# Patient Record
Sex: Female | Born: 1980 | Race: Asian | Hispanic: No | Marital: Married | Smoking: Never smoker
Health system: Southern US, Community
[De-identification: ages and names within clinical notes are randomized; demographics above are authoritative.]

## PROBLEM LIST (undated history)

## (undated) DIAGNOSIS — K759 Inflammatory liver disease, unspecified: Secondary | ICD-10-CM

## (undated) HISTORY — DX: Inflammatory liver disease, unspecified: K75.9

## (undated) HISTORY — PX: NO PAST SURGERIES: SHX2092

---

## 2014-01-12 NOTE — L&D Delivery Note (Signed)
Patient is 34 y.o. G1P0000 [redacted]w[redacted]d admitted for IOL 2/2 gHTN, hx of chronic hepatitis B,  GBS+ with adequate ppx  Operative Delivery Note At 9:44 PM a viable female was delivered via Vaginal, Spontaneous Delivery.  Presentation: vertex; Position: Anterior; Station: +3.  Verbal consent: obtained from patient.  Risks and benefits discussed in detail.  Risks include, but are not limited to the risks of anesthesia, bleeding, infection, damage to maternal tissues, fetal cephalhematoma.  There is also the risk of inability to effect vaginal delivery of the head, or shoulder dystocia that cannot be resolved by established maneuvers, leading to the need for emergency cesarean section.  ~52min shoulder dystocia Delivery of the head:   ,   1st maneuver: McRoberts 2nd Maneuver: attempted delivery of posterior shoulder 3rd Maneuver: successful woods corkscrew 4th Maneuver: Suprapubic pressure 5th Maneuver: delivery of posterior shoulder  APGAR: 9, 9; weight 7 lb 15.3 oz (3610 g).   Placenta status: Intact, Spontaneous.   Cord: 3 vessels with the following complications: None.  Cord pH: 7.  Anesthesia: Epidural  Instruments: Kiwi Vacuum Episiotomy: None Lacerations: 3rd degree  Extensive 3rd degree with extensions to bilateral labia.  Left labia extending to large deficit that was bleeding heavily.  Repair hemostatic. Suture Repair: 3.0 vicryl Est. Blood Loss (mL):  Mom to postpartum.  Baby to Couplet care / Skin to Skin.  Postpartum hemorrhage secondary to vaginal bleeding => CBC ordered.  Patient febrile at time of delivery with leukocytosis of 17,000.  No maternal or fetal tachycardia, no foul smelling odor, no signs of intra-mniotic infection.  Christy Huff 07/03/2014, 11:00 PM

## 2014-02-01 ENCOUNTER — Other Ambulatory Visit (HOSPITAL_COMMUNITY): Payer: Self-pay | Admitting: Nurse Practitioner

## 2014-02-01 DIAGNOSIS — Z3689 Encounter for other specified antenatal screening: Secondary | ICD-10-CM

## 2014-02-08 ENCOUNTER — Ambulatory Visit (HOSPITAL_COMMUNITY)
Admission: RE | Admit: 2014-02-08 | Discharge: 2014-02-08 | Disposition: A | Discharge status: Home / Self Care | Payer: Self-pay | Source: Ambulatory Visit | Attending: Nurse Practitioner | Admitting: Nurse Practitioner

## 2014-02-08 DIAGNOSIS — Z3689 Encounter for other specified antenatal screening: Secondary | ICD-10-CM

## 2014-02-08 DIAGNOSIS — Z36 Encounter for antenatal screening of mother: Secondary | ICD-10-CM | POA: Insufficient documentation

## 2014-07-02 ENCOUNTER — Inpatient Hospital Stay (HOSPITAL_COMMUNITY)
Admission: AD | Admit: 2014-07-02 | Discharge: 2014-07-05 | DRG: 988 | Disposition: A | Discharge status: Home / Self Care | Payer: Medicaid Other | Source: Ambulatory Visit | Attending: Family Medicine | Admitting: Family Medicine

## 2014-07-02 ENCOUNTER — Encounter (HOSPITAL_COMMUNITY): Payer: Self-pay | Admitting: *Deleted

## 2014-07-02 DIAGNOSIS — Z3A39 39 weeks gestation of pregnancy: Secondary | ICD-10-CM | POA: Diagnosis present

## 2014-07-02 DIAGNOSIS — O99824 Streptococcus B carrier state complicating childbirth: Secondary | ICD-10-CM | POA: Diagnosis present

## 2014-07-02 DIAGNOSIS — O133 Gestational [pregnancy-induced] hypertension without significant proteinuria, third trimester: Secondary | ICD-10-CM | POA: Diagnosis present

## 2014-07-02 DIAGNOSIS — O139 Gestational [pregnancy-induced] hypertension without significant proteinuria, unspecified trimester: Secondary | ICD-10-CM | POA: Diagnosis present

## 2014-07-02 LAB — CREATININE CLEARANCE, URINE, 24 HOUR
Collection Interval-CRCL: 24 hours
Creatinine Clearance: 149 mL/min — ABNORMAL HIGH (ref 75–115)
Creatinine, 24H Ur: 1267 mg/d (ref 600–1800)
Creatinine, Urine: 84.48 mg/dL
Urine Total Volume-CRCL: 1500 mL

## 2014-07-02 LAB — CBC
HEMATOCRIT: 39.1 % (ref 36.0–46.0)
Hemoglobin: 13.3 g/dL (ref 12.0–15.0)
MCH: 27.7 pg (ref 26.0–34.0)
MCHC: 34 g/dL (ref 30.0–36.0)
MCV: 81.3 fL (ref 78.0–100.0)
Platelets: 198 10*3/uL (ref 150–400)
RBC: 4.81 MIL/uL (ref 3.87–5.11)
RDW: 14.9 % (ref 11.5–15.5)
WBC: 7.3 10*3/uL (ref 4.0–10.5)

## 2014-07-02 LAB — TYPE AND SCREEN
ABO/RH(D): A POS
Antibody Screen: NEGATIVE

## 2014-07-02 LAB — COMPREHENSIVE METABOLIC PANEL
ALT: 14 U/L (ref 14–54)
AST: 28 U/L (ref 15–41)
Albumin: 3 g/dL — ABNORMAL LOW (ref 3.5–5.0)
Alkaline Phosphatase: 328 U/L — ABNORMAL HIGH (ref 38–126)
Anion gap: 7 (ref 5–15)
BUN: 8 mg/dL (ref 6–20)
CALCIUM: 8.8 mg/dL — AB (ref 8.9–10.3)
CO2: 20 mmol/L — ABNORMAL LOW (ref 22–32)
Chloride: 108 mmol/L (ref 101–111)
Creatinine, Ser: 0.59 mg/dL (ref 0.44–1.00)
GFR calc non Af Amer: >60 mL/min (ref >60)
GLUCOSE: 84 mg/dL (ref 65–99)
Potassium: 4 mmol/L (ref 3.5–5.1)
SODIUM: 135 mmol/L (ref 135–145)
TOTAL PROTEIN: 6.5 g/dL (ref 6.5–8.1)
Total Bilirubin: 0.5 mg/dL (ref 0.3–1.2)

## 2014-07-02 LAB — OB RESULTS CONSOLE RPR: RPR: NONREACTIVE

## 2014-07-02 LAB — PROTEIN, URINE, 24 HOUR
COLLECTION INTERVAL-UPROT: 24 h
Protein, 24H Urine: 150 mg/d — ABNORMAL HIGH (ref 50–100)
Protein, Urine: 10 mg/dL
Urine Total Volume-UPROT: 1500 mL

## 2014-07-02 LAB — OB RESULTS CONSOLE ABO/RH: RH Type: POSITIVE

## 2014-07-02 LAB — PROTEIN / CREATININE RATIO, URINE
Creatinine, Urine: 46 mg/dL
PROTEIN CREATININE RATIO: 0.15 mg/mg{creat} (ref 0.00–0.15)
Total Protein, Urine: 7 mg/dL

## 2014-07-02 LAB — OB RESULTS CONSOLE GC/CHLAMYDIA
CHLAMYDIA, DNA PROBE: NEGATIVE
GC PROBE AMP, GENITAL: NEGATIVE

## 2014-07-02 LAB — OB RESULTS CONSOLE RUBELLA ANTIBODY, IGM: Rubella: IMMUNE

## 2014-07-02 LAB — OB RESULTS CONSOLE GBS: GBS: POSITIVE

## 2014-07-02 LAB — OB RESULTS CONSOLE ANTIBODY SCREEN: Antibody Screen: NEGATIVE

## 2014-07-02 LAB — ABO/RH: ABO/RH(D): A POS

## 2014-07-02 LAB — OB RESULTS CONSOLE HIV ANTIBODY (ROUTINE TESTING): HIV: NONREACTIVE

## 2014-07-02 LAB — OB RESULTS CONSOLE HEPATITIS B SURFACE ANTIGEN: Hepatitis B Surface Ag: POSITIVE

## 2014-07-02 MED ORDER — TERBUTALINE SULFATE 1 MG/ML IJ SOLN: 0.2500 mg | Freq: Once | INTRAMUSCULAR | Status: AC | PRN

## 2014-07-02 MED ORDER — PENICILLIN G POTASSIUM 5000000 UNITS IJ SOLR
2.5000 10*6.[IU] | INTRAVENOUS | Status: DC
  Filled 2014-07-02 (×4): qty 2.5

## 2014-07-02 MED ORDER — CITRIC ACID-SODIUM CITRATE 334-500 MG/5ML PO SOLN: 30.0000 mL | ORAL | Status: DC | PRN

## 2014-07-02 MED ORDER — OXYTOCIN BOLUS FROM INFUSION
500.0000 mL | INTRAVENOUS | Status: DC
  Administered 2014-07-03: 500 mL via INTRAVENOUS

## 2014-07-02 MED ORDER — MISOPROSTOL 25 MCG QUARTER TABLET
25.0000 ug | ORAL_TABLET | ORAL | Status: DC | PRN
  Filled 2014-07-02: qty 0.25

## 2014-07-02 MED ORDER — OXYCODONE-ACETAMINOPHEN 5-325 MG PO TABS: 1.0000 | ORAL_TABLET | ORAL | Status: DC | PRN

## 2014-07-02 MED ORDER — MISOPROSTOL 200 MCG PO TABS
50.0000 ug | ORAL_TABLET | ORAL | Status: DC | PRN
  Administered 2014-07-02 – 2014-07-03 (×4): 50 ug via ORAL
  Filled 2014-07-02 (×4): qty 0.5

## 2014-07-02 MED ORDER — ONDANSETRON HCL 4 MG/2ML IJ SOLN: 4.0000 mg | Freq: Four times a day (QID) | INTRAMUSCULAR | Status: DC | PRN

## 2014-07-02 MED ORDER — ACETAMINOPHEN 325 MG PO TABS
650.0000 mg | ORAL_TABLET | ORAL | Status: DC | PRN
  Administered 2014-07-03: 650 mg via ORAL
  Filled 2014-07-02 (×2): qty 2

## 2014-07-02 MED ORDER — OXYCODONE-ACETAMINOPHEN 5-325 MG PO TABS: 2.0000 | ORAL_TABLET | ORAL | Status: DC | PRN

## 2014-07-02 MED ORDER — PENICILLIN G POTASSIUM 5000000 UNITS IJ SOLR
5.0000 10*6.[IU] | Freq: Once | INTRAMUSCULAR | Status: DC
  Filled 2014-07-02: qty 5

## 2014-07-02 MED ORDER — LACTATED RINGERS IV SOLN
500.0000 mL | INTRAVENOUS | Status: DC | PRN
Start: 2014-07-02 — End: 2014-07-03
  Administered 2014-07-03: 500 mL via INTRAVENOUS

## 2014-07-02 MED ORDER — LACTATED RINGERS IV SOLN
INTRAVENOUS | Status: DC
  Administered 2014-07-02 – 2014-07-03 (×4): via INTRAVENOUS

## 2014-07-02 MED ORDER — OXYTOCIN 40 UNITS IN LACTATED RINGERS INFUSION - SIMPLE MED: 62.5000 mL/h | INTRAVENOUS | Status: DC

## 2014-07-02 MED ORDER — LIDOCAINE HCL (PF) 1 % IJ SOLN
30.0000 mL | INTRAMUSCULAR | Status: DC | PRN
  Filled 2014-07-02: qty 30

## 2014-07-02 MED ORDER — FENTANYL CITRATE (PF) 100 MCG/2ML IJ SOLN: 50.0000 ug | INTRAMUSCULAR | Status: DC | PRN

## 2014-07-02 NOTE — Progress Notes (Signed)
Dr Delanna Ahmadi notified regarding unable to place Cytotec d/t fetal tachycardia. Will call back.

## 2014-07-02 NOTE — Progress Notes (Signed)
Admission completed with [employer] interpreter [phone]

## 2014-07-02 NOTE — Progress Notes (Signed)
Patient ID: Christy Huff, female   DOB: 1980/07/04, 34 y.o.   MRN: [id number] Patient ID: Christy Huff, female   DOB: 1980-10-29, 34 y.o.   MRN: [id number] Labor Progress Note  ASSESSMENT:   Christy Huff 34 y.o. G1P0000 at [redacted]w[redacted]d in induced labor   PLAN:  1) Labor curve reviewed.       Progress: Latent, induced             Plan: Cytotec placed at 12:30, again at 1730  2) Fetal heart tracing reviewed.  Cat I, reassuring  3) GBS Status - POSITIVE - start PCN once in active labot  4) Other Problems Active Problems:   Gestational hypertension   SUBJECTIVE:  Endorses cramping pain   OBJECTIVE:  Vital Signs:  Patient Vitals for the past 2 hrs:  BP Temp Temp src Pulse Resp  07/02/14 1642 124/87 mmHg 98.1 F (36.7 C) Oral 91 20   SVE: Dilation: 1, Effacement (%): 50, Station: -3  FHR Monitoring Baseline Rate (A): 130 bpm/mod/+ accels/no decels   Accelerations: 15 x 15 Contraction Frequency (min): 2-6

## 2014-07-02 NOTE — H&P (Signed)
LABOR ADMISSION HISTORY AND PHYSICAL  Patient speaks [place] phone interpretor used with nurse.  Christy Huff is a 34 y.o. female G1P0 with IUP at [redacted]w[redacted]d by LMP consistent with [place] presenting for IOL 2/2 gHTN. She reports +FM, + contractions, No LOF, no VB, no blurry vision, headaches or peripheral edema, and RUQ pain.  She plans on breast feeding. She request condoms for birth control.  Dating: By LMP consistent with US--->  Estimated Date of Delivery: None noted.  Sono:   , CWD, normal anatomy, transverse presentation, 287g, 47% EFW   Prenatal History/Complications: -Prenatal care at HD -Positive Hep B  Past Medical History: Past Medical History  Diagnosis Date  . Medical history non-contributory     Past Surgical History: Past Surgical History  Procedure Laterality Date  . No past surgeries      Obstetrical History: OB History    Gravida Para Term Preterm AB TAB SAB Ectopic Multiple Living   1 0 0 0 0 0 0 0 0       Social History: History   Social History  . Marital Status: Married    Spouse Name: N/A  . Number of Children: N/A  . Years of Education: N/A   Social History Main Topics  . Smoking status: Never Smoker   . Smokeless tobacco: Never Used  . Alcohol Use: No  . Drug Use: No  . Sexual Activity: Yes   Other Topics Concern  . None   Social History Narrative  . None    Family History: No family history on file.  Allergies: No Known Allergies  Prescriptions prior to admission  Medication Sig Dispense Refill Last Dose  . Prenatal Vit-Fe Fumarate-FA (PRENATAL MULTIVITAMIN) TABS tablet Take 1 tablet by mouth daily at 12 noon.   07/02/2014 at Unknown time     Review of Systems  All systems reviewed and negative except as stated in HPI  BP 143/77 mmHg  Pulse 88  Resp 20  Ht 4' 9.87" (1.47 m)  Wt 144 lb (65.318 kg)  BMI 30.23 kg/m2 General appearance: alert, cooperative and no distress Lungs: clear to auscultation bilaterally Heart:  regular rate and rhythm Abdomen: soft, non-tender; bowel sounds normal Pelvic: Adequate Extremities: Homans sign is negative, no sign of DVT, edema DTR's present Presentation: Unknown Fetal monitoringBaseline: 125 bpm, Variability: Good {> 6 bpm), Accelerations: Reactive and Decelerations: Absent Uterine activity Frequency: Every 2-4 minutes Dilation: Closed Effacement (%): Thick Station: -3 Exam by:: Dr Doroteo Glassman   Prenatal labs: ABO, Rh: A/Positive/-- (06/20 1152) Antibody: Negative (06/20 1152) Rubella:   RPR: Nonreactive (06/20 1152)  HBsAg: Positive (06/20 1152)  HIV: Non-reactive (06/20 1152)  GBS: Positive (06/20 1152)  1 hr Glucola 113 (failed); passed 3 hr 67/168/106/129 Genetic screening not done Anatomy US normal  Prenatal Transfer Tool  Maternal Diabetes: No Genetic Screening: Declined Maternal Ultrasounds/Referrals: Normal Fetal Ultrasounds or other Referrals:  None Maternal Substance Abuse:  No Significant Maternal Medications:  None Significant Maternal Lab Results: Lab values include: Group B Strep positive, HBsAG positive  Results for orders placed or performed during the hospital encounter of 07/02/14 (from the past 24 hour(s))  CBC   Collection Time: 07/02/14 11:45 AM  Result Value Ref Range   WBC 7.3 4.0 - 10.5 K/uL   RBC 4.81 3.87 - 5.11 MIL/uL   Hemoglobin 13.3 12.0 - 15.0 g/dL   HCT 56.2 13.0 - 86.5 %   MCV 81.3 78.0 - 100.0 fL   MCH 27.7 26.0 - 34.0  pg   MCHC 34.0 30.0 - 36.0 g/dL   RDW 82.0 60.1 - 56.1 %   Platelets 198 150 - 400 K/uL  OB RESULT CONSOLE Group B Strep   Collection Time: 07/02/14 11:52 AM  Result Value Ref Range   GBS Positive   OB RESULTS CONSOLE GC/Chlamydia   Collection Time: 07/02/14 11:52 AM  Result Value Ref Range   Gonorrhea Negative    Chlamydia Negative   OB RESULTS CONSOLE RPR   Collection Time: 07/02/14 11:52 AM  Result Value Ref Range   RPR Nonreactive   OB RESULTS CONSOLE HIV antibody   Collection Time:  07/02/14 11:52 AM  Result Value Ref Range   HIV Non-reactive   OB RESULTS CONSOLE Rubella Antibody   Collection Time: 07/02/14 11:52 AM  Result Value Ref Range   Rubella Immune   OB RESULTS CONSOLE Hepatitis B surface antigen   Collection Time: 07/02/14 11:52 AM  Result Value Ref Range   Hepatitis B Surface Ag Positive   OB RESULTS CONSOLE ABO/Rh   Collection Time: 07/02/14 11:52 AM  Result Value Ref Range   RH Type  Positive    ABO Grouping A   OB RESULTS CONSOLE Antibody Screen   Collection Time: 07/02/14 11:52 AM  Result Value Ref Range   Antibody Screen Negative     Patient Active Problem List   Diagnosis Date Noted  . Gestational hypertension 07/02/2014    Assessment: Tashala Klocko is a 34 y.o. IUP at [redacted]w[redacted]d by LMP consistent with [place] presenting for IOL 2/2 gHTN.  #Labor: IOL with Cytotec for cervical ripening. Will recheck patient and if cervix adequate will place FB.   Will obtain bedside US for fetal presentation #gHTN: Obtaining preE labs. Monitor BPs. #Pain: Labor support without medications; pain medications up patient request. #FWB: Cat 1 #ID: Hep B+ and GBS +; will start PCN #MOF: Breast #MOC: Condoms #Circ: N/A, female  Caryl Ada, DO 07/02/2014, 12:17 PM PGY-1, [facility]  OB fellow attestation:  I have seen and examined this patient; I agree with above documentation in the resident's note.   Antoine Zieger is a 34 y.o. G1P0000 here for IOL 2/2 gHTN.   PE: BP 143/77 mmHg  Pulse 88  Resp 20  Ht 4' 9.87" (1.47 m)  Wt 144 lb (65.318 kg)  BMI 30.23 kg/m2 Gen: calm comfortable, NAD Resp: normal effort, no distress Abd: gravid Cephalic presentation confirmed with bedside sono by me  ROS, labs, PMH reviewed  Plan: MOF: breast MOC: condoms ID: GBS+ => start PCN once cervical ripening phase over FWB: cat I Labor: cytotec, FB when able.  Cervix closed Pain: epidural upon request GHTN: has 24h urine with her, will send for results.   Will also order CMP and uPC for now as 24h urine studies will likely not result for several hours.  Rontae Inglett ROCIO 07/02/2014, 12:29 PM

## 2014-07-02 NOTE — Progress Notes (Signed)
Patient ID: Christy Huff, female   DOB: 11/01/1980, 34 y.o.   MRN: [id number] Labor Progress Note  ASSESSMENT:   Christy Huff 34 y.o. G1P0000 at [redacted]w[redacted]d in induced labor   PLAN:  1) Labor curve reviewed.       Progress: Latent, induced             Plan: Cytotec placed at 12:30; reassess if needs additional dose vs FB at 1630  2) Fetal heart tracing reviewed.  Cat I, reassuring  3) GBS Status - POSITIVE - start PCN once in active labot  4) Other Problems Active Problems:   Gestational hypertension   SUBJECTIVE: starting to feel pain w/ contractions   OBJECTIVE:  Vital Signs: No data found.  SVE: Dilation: Closed, Effacement (%): Thick, Station: -3  FHR Monitoring Baseline Rate (A): 135 bpm/mod/+ accels/no decels   Accelerations: 15 x 15 Contraction Frequency (min): 3-5

## 2014-07-02 NOTE — Progress Notes (Signed)
Patient ID: Zoann Aguallo, female   DOB: 13-Apr-1980, 34 y.o.   MRN: [id number] Labor Progress Note  S: patient is resting comfortably. Starting to feel some contractions, mild to moderate in pain  O:  BP 110/72 mmHg  Pulse 76  Temp(Src) 98.1 F (36.7 C) (Oral)  Resp 20  Ht 4' 9.87" (1.47 m)  Wt 65.318 kg (144 lb)  BMI 30.23 kg/m2 Cat I. Baseline 155, + accelerations, - decels Contractions q3-5 minutes CVE: dilation 1 cm, 50%, -3, firm  A&P: 34 y.o. G1P0000 [redacted]w[redacted]d admitted for IOL due to gHTN.  # Labor. Not yet in labor. No cervical change. Repeat cytotec dose. Attempt foley bulb again at next check # FWB. Cat I  # GBS Positive. Start penicillin when in active labor # Gestational HTN. BP all well within range.  Fabio Asa, MD 9:27 PM

## 2014-07-03 ENCOUNTER — Inpatient Hospital Stay (HOSPITAL_COMMUNITY): Payer: Medicaid Other | Admitting: Anesthesiology

## 2014-07-03 ENCOUNTER — Encounter (HOSPITAL_COMMUNITY): Payer: Self-pay | Admitting: Anesthesiology

## 2014-07-03 DIAGNOSIS — O99824 Streptococcus B carrier state complicating childbirth: Secondary | ICD-10-CM

## 2014-07-03 DIAGNOSIS — O133 Gestational [pregnancy-induced] hypertension without significant proteinuria, third trimester: Secondary | ICD-10-CM

## 2014-07-03 DIAGNOSIS — Z3A39 39 weeks gestation of pregnancy: Secondary | ICD-10-CM

## 2014-07-03 LAB — CBC
HCT: 38.8 % (ref 36.0–46.0)
HCT: 36.1 % (ref 36.0–46.0)
HEMATOCRIT: 37.7 % (ref 36.0–46.0)
HEMOGLOBIN: 13.3 g/dL (ref 12.0–15.0)
Hemoglobin: 12.5 g/dL (ref 12.0–15.0)
Hemoglobin: 12.2 g/dL (ref 12.0–15.0)
MCH: 27.1 pg (ref 26.0–34.0)
MCH: 28 pg (ref 26.0–34.0)
MCH: 27.6 pg (ref 26.0–34.0)
MCHC: 33.2 g/dL (ref 30.0–36.0)
MCHC: 34.3 g/dL (ref 30.0–36.0)
MCHC: 33.8 g/dL (ref 30.0–36.0)
MCV: 81.6 fL (ref 78.0–100.0)
MCV: 81.7 fL (ref 78.0–100.0)
MCV: 81.7 fL (ref 78.0–100.0)
PLATELETS: 185 10*3/uL (ref 150–400)
PLATELETS: 193 10*3/uL (ref 150–400)
Platelets: 185 10*3/uL (ref 150–400)
RBC: 4.62 MIL/uL (ref 3.87–5.11)
RBC: 4.75 MIL/uL (ref 3.87–5.11)
RBC: 4.42 MIL/uL (ref 3.87–5.11)
RDW: 14.8 % (ref 11.5–15.5)
RDW: 14.8 % (ref 11.5–15.5)
RDW: 15 % (ref 11.5–15.5)
WBC: 12.2 10*3/uL — ABNORMAL HIGH (ref 4.0–10.5)
WBC: 17 10*3/uL — ABNORMAL HIGH (ref 4.0–10.5)
WBC: 20 10*3/uL — ABNORMAL HIGH (ref 4.0–10.5)

## 2014-07-03 LAB — HIV ANTIBODY (ROUTINE TESTING W REFLEX): HIV Screen 4th Generation wRfx: NONREACTIVE

## 2014-07-03 LAB — RPR: RPR Ser Ql: NONREACTIVE

## 2014-07-03 MED ORDER — OXYTOCIN 40 UNITS IN LACTATED RINGERS INFUSION - SIMPLE MED
1.0000 m[IU]/min | INTRAVENOUS | Status: DC
  Administered 2014-07-03: 2 m[IU]/min via INTRAVENOUS
  Filled 2014-07-03: qty 1000

## 2014-07-03 MED ORDER — PENICILLIN G POTASSIUM 5000000 UNITS IJ SOLR
5.0000 10*6.[IU] | Freq: Once | INTRAVENOUS | Status: AC
  Administered 2014-07-03: 5 10*6.[IU] via INTRAVENOUS
  Filled 2014-07-03: qty 5

## 2014-07-03 MED ORDER — OXYCODONE-ACETAMINOPHEN 5-325 MG PO TABS: 1.0000 | ORAL_TABLET | ORAL | Status: DC | PRN

## 2014-07-03 MED ORDER — PENICILLIN G POTASSIUM 5000000 UNITS IJ SOLR
2.5000 10*6.[IU] | INTRAVENOUS | Status: DC
  Administered 2014-07-03 (×2): 2.5 10*6.[IU] via INTRAVENOUS
  Filled 2014-07-03 (×4): qty 2.5

## 2014-07-03 MED ORDER — BENZOCAINE-MENTHOL 20-0.5 % EX AERO
1.0000 "application " | INHALATION_SPRAY | CUTANEOUS | Status: DC | PRN
  Filled 2014-07-03: qty 56

## 2014-07-03 MED ORDER — ACETAMINOPHEN 325 MG PO TABS: 650.0000 mg | ORAL_TABLET | ORAL | Status: DC | PRN

## 2014-07-03 MED ORDER — EPHEDRINE 5 MG/ML INJ
10.0000 mg | INTRAVENOUS | Status: DC | PRN
  Filled 2014-07-03: qty 2

## 2014-07-03 MED ORDER — SIMETHICONE 80 MG PO CHEW: 80.0000 mg | CHEWABLE_TABLET | ORAL | Status: DC | PRN

## 2014-07-03 MED ORDER — FENTANYL 2.5 MCG/ML BUPIVACAINE 1/10 % EPIDURAL INFUSION (WH - ANES)
14.0000 mL/h | INTRAMUSCULAR | Status: DC | PRN
  Administered 2014-07-03 (×3): 14 mL/h via EPIDURAL
  Filled 2014-07-03 (×2): qty 125

## 2014-07-03 MED ORDER — ZOLPIDEM TARTRATE 5 MG PO TABS: 5.0000 mg | ORAL_TABLET | Freq: Every evening | ORAL | Status: DC | PRN

## 2014-07-03 MED ORDER — ACETAMINOPHEN 650 MG RE SUPP
650.0000 mg | RECTAL | Status: DC | PRN
  Administered 2014-07-03: 650 mg via RECTAL
  Filled 2014-07-03: qty 1

## 2014-07-03 MED ORDER — IBUPROFEN 600 MG PO TABS
600.0000 mg | ORAL_TABLET | Freq: Four times a day (QID) | ORAL | Status: DC
  Administered 2014-07-04 – 2014-07-05 (×7): 600 mg via ORAL
  Filled 2014-07-03 (×7): qty 1

## 2014-07-03 MED ORDER — DIBUCAINE 1 % RE OINT
1.0000 "application " | TOPICAL_OINTMENT | RECTAL | Status: DC | PRN
  Filled 2014-07-03: qty 28

## 2014-07-03 MED ORDER — ONDANSETRON HCL 4 MG PO TABS: 4.0000 mg | ORAL_TABLET | ORAL | Status: DC | PRN

## 2014-07-03 MED ORDER — OXYCODONE-ACETAMINOPHEN 5-325 MG PO TABS: 2.0000 | ORAL_TABLET | ORAL | Status: DC | PRN

## 2014-07-03 MED ORDER — PRENATAL MULTIVITAMIN CH
1.0000 | ORAL_TABLET | Freq: Every day | ORAL | Status: DC
  Administered 2014-07-04 – 2014-07-05 (×2): 1 via ORAL
  Filled 2014-07-03 (×2): qty 1

## 2014-07-03 MED ORDER — DIPHENHYDRAMINE HCL 25 MG PO CAPS: 25.0000 mg | ORAL_CAPSULE | Freq: Four times a day (QID) | ORAL | Status: DC | PRN

## 2014-07-03 MED ORDER — LANOLIN HYDROUS EX OINT: TOPICAL_OINTMENT | CUTANEOUS | Status: DC | PRN

## 2014-07-03 MED ORDER — LIDOCAINE HCL (PF) 1 % IJ SOLN
INTRAMUSCULAR | Status: DC | PRN
  Administered 2014-07-03 (×2): 8 mL

## 2014-07-03 MED ORDER — PHENYLEPHRINE 40 MCG/ML (10ML) SYRINGE FOR IV PUSH (FOR BLOOD PRESSURE SUPPORT)
80.0000 ug | PREFILLED_SYRINGE | INTRAVENOUS | Status: DC | PRN
  Filled 2014-07-03: qty 2
  Filled 2014-07-03: qty 20

## 2014-07-03 MED ORDER — SENNOSIDES-DOCUSATE SODIUM 8.6-50 MG PO TABS
2.0000 | ORAL_TABLET | ORAL | Status: DC
  Administered 2014-07-04 (×2): 2 via ORAL
  Filled 2014-07-03 (×2): qty 2

## 2014-07-03 MED ORDER — WITCH HAZEL-GLYCERIN EX PADS: 1.0000 "application " | MEDICATED_PAD | CUTANEOUS | Status: DC | PRN

## 2014-07-03 MED ORDER — DIPHENHYDRAMINE HCL 50 MG/ML IJ SOLN: 12.5000 mg | INTRAMUSCULAR | Status: DC | PRN

## 2014-07-03 MED ORDER — ONDANSETRON HCL 4 MG/2ML IJ SOLN: 4.0000 mg | INTRAMUSCULAR | Status: DC | PRN

## 2014-07-03 MED ORDER — TETANUS-DIPHTH-ACELL PERTUSSIS 5-2.5-18.5 LF-MCG/0.5 IM SUSP
0.5000 mL | Freq: Once | INTRAMUSCULAR | Status: DC
  Filled 2014-07-03: qty 0.5

## 2014-07-03 NOTE — Progress Notes (Signed)
[employer] Interpreter [phone] on the phone while we start pushing with pt.

## 2014-07-03 NOTE — Progress Notes (Signed)
[employer] interpreter [phone] on the phone

## 2014-07-03 NOTE — Progress Notes (Signed)
[employer] interpreter [phone] on the phone for epidural placement

## 2014-07-03 NOTE — Progress Notes (Signed)
Labor Progress Note  Christy Huff is a 34 y.o. G1P0000 at [redacted]w[redacted]d  admitted for induction of labor due to gHTN.  S: Spoke to patient with phone interpretor. She is endorsing some upper back pain were the epidural covering is. Otherwise she says her pain is controlled with epidural. She is just feeling pelvic pressure.   O:  BP 137/89 mmHg  Pulse 95  Temp(Src) 98.7 F (37.1 C) (Oral)  Resp 20  Ht 4' 9.87" (1.47 m)  Wt 144 lb (65.318 kg)  BMI 30.23 kg/m2  SpO2 100% FHT:  FHR: 140 bpm, variability: moderate,  accelerations:  Abscent,  decelerations:  Present Variable UC:  Regular every 2-4 min SVE:   Dilation: 8 Effacement (%): 90 Station: -1 Exam by:: Goss, RNC SROM @0905 : light mec  S/p cytotec x4  Labs: Lab Results  Component Value Date   WBC 12.2* 07/03/2014   HGB 12.5 07/03/2014   HCT 37.7 07/03/2014   MCV 81.6 07/03/2014   PLT 185 07/03/2014    Assessment / Plan: 34 y.o. G1P0000 [redacted]w[redacted]d in active labor Induction of labor due to gestational hypertension,  progressing well  Labor: Progressing on Pitocin, s/p SROM. Fetal Wellbeing:  Category II Pain Control:  Epidural Anticipated MOD:  NSVD  Expectant management   Caryl Ada, DO 07/03/2014, 3:07 PM PGY-1, [facility]

## 2014-07-03 NOTE — Progress Notes (Signed)
[employer]preter [phone] on the phone for shift assessment

## 2014-07-03 NOTE — Progress Notes (Signed)
Patient ID: Christy Huff, female   DOB: 06/02/1980, 34 y.o.   MRN: [id number] Labor Progress Note  S: patient reports some mild pain  O:  BP 135/75 mmHg  Pulse 73  Temp(Src) 97.7 F (36.5 C) (Oral)  Resp 20  Ht 4' 9.87" (1.47 m)  Wt 65.318 kg (144 lb)  BMI 30.23 kg/m2 Cat I. Baseline 130 + accelerations, - decels Contractions q2-5 minutes, irregular patter Dilation: 1.5 Effacement (%): 50 Station: -3 Presentation: Vertex Exam by:: dr Demba Nigh  Foley bulb placed with exam.    A&P: 34 y.o. G1P0000 [redacted]w[redacted]d admitted for IOL due to gHTN.  # Labor. Not yet in labor. Min cervical change with cytotec x 3. Foley bulb now in place. Will give 4th dose of cytotec if contractions are <3/10 minutes  # FWB. Cat I  # GBS Positive. Start penicillin when in active labor # Gestational HTN. BP all well within range.  Fabio Asa, MD 4:27 AM

## 2014-07-03 NOTE — Anesthesia Preprocedure Evaluation (Signed)
Anesthesia Evaluation  Patient identified by MRN, date of birth, ID band Patient awake    Reviewed: Allergy & Precautions, H&P , NPO status , Patient's Chart, lab work & pertinent test results  Airway Mallampati: II  TM Distance: >3 FB Neck ROM: full    Dental no notable dental hx.    Pulmonary neg pulmonary ROS,    Pulmonary exam normal       Cardiovascular hypertension, Normal cardiovascular exam    Neuro/Psych negative neurological ROS  negative psych ROS   GI/Hepatic negative GI ROS, Neg liver ROS,   Endo/Other  negative endocrine ROS  Renal/GU negative Renal ROS     Musculoskeletal   Abdominal Normal abdominal exam  (+)   Peds  Hematology negative hematology ROS (+)   Anesthesia Other Findings   Reproductive/Obstetrics (+) Pregnancy                             Anesthesia Physical Anesthesia Plan  ASA: II  Anesthesia Plan: Epidural   Post-op Pain Management:    Induction:   Airway Management Planned:   Additional Equipment:   Intra-op Plan:   Post-operative Plan:   Informed Consent: I have reviewed the patients History and Physical, chart, labs and discussed the procedure including the risks, benefits and alternatives for the proposed anesthesia with the patient or authorized representative who has indicated his/her understanding and acceptance.     Plan Discussed with:   Anesthesia Plan Comments:         Anesthesia Quick Evaluation

## 2014-07-03 NOTE — Progress Notes (Signed)
[employer]preter [phone] on the phone for pushing

## 2014-07-03 NOTE — Progress Notes (Signed)
Patient ID: Christy Huff, female   DOB: 1980/02/02, 34 y.o.   MRN: [id number] Labor Progress Note  S: patient is resting comfortably.   O:  BP 144/67 mmHg  Pulse 71  Temp(Src) 97.7 F (36.5 C) (Oral)  Resp 20  Ht 4' 9.87" (1.47 m)  Wt 65.318 kg (144 lb)  BMI 30.23 kg/m2 Cat I. Baseline 130+ accelerations, - decels Contractions q1-5 minutes   A&P: 34 y.o. G1P0000 [redacted]w[redacted]d admitted for IOL due to gHTN.  # Labor. Not yet in labor. No cervical change. Has received 3rd dose of cytotec. Can attempt foley bulb again at next check # FWB. Cat I  # GBS Positive. Start penicillin when in active labor # Gestational HTN. BP all well within range.  Fabio Asa, MD 2:10 AM

## 2014-07-03 NOTE — Progress Notes (Signed)
Labor Progress Note  Christy Huff is a 34 y.o. G1P0000 at [redacted]w[redacted]d  admitted for induction of labor due to gHTN.  S: Pain currently 10/10. Patient more uncomfortable after SROM. Requesting epidural.   O:  BP 134/77 mmHg  Pulse 84  Temp(Src) 98.7 F (37.1 C) (Oral)  Resp 18  Ht 4' 9.87" (1.47 m)  Wt 144 lb (65.318 kg)  BMI 30.23 kg/m2 FHT:  FHR: 130 bpm, variability: moderate,  accelerations:  Present,  decelerations:  Absent UC:   irregular, every 2-6 minutes SVE:   Dilation: 5 Effacement (%): 60, 70 Station: -2 Exam by:: Renaldo Harrison, RNC SROM @0905 : light mec  S/p cytotec x4  Labs: Lab Results  Component Value Date   WBC 7.3 07/02/2014   HGB 13.3 07/02/2014   HCT 39.1 07/02/2014   MCV 81.3 07/02/2014   PLT 198 07/02/2014    Assessment / Plan: 34 y.o. G1P0000 [redacted]w[redacted]d in active labor Induction of labor due to gestational hypertension,  progressing well  Labor: Progressing normally, s/p FB and Cytotec x4. Will start Pitocin after epidural. Fetal Wellbeing:  Category I Pain Control:  Epidural Anticipated MOD:  NSVD  Expectant management   Caryl Ada, DO 07/03/2014, 9:42 AM PGY-1, [facility]

## 2014-07-03 NOTE — Anesthesia Procedure Notes (Signed)
Epidural Patient location during procedure: OB Start time: 07/03/2014 9:52 AM End time: 07/03/2014 9:58 AM  Staffing Anesthesiologist: Leilani Able Performed by: anesthesiologist   Preanesthetic Checklist Completed: patient identified, surgical consent, pre-op evaluation, timeout performed, IV checked, risks and benefits discussed and monitors and equipment checked  Epidural Patient position: sitting Prep: site prepped and draped and DuraPrep Patient monitoring: continuous pulse ox and blood pressure Approach: midline Location: L3-L4 Injection technique: LOR air  Needle:  Needle type: Tuohy  Needle gauge: 17 G Needle length: 9 cm and 9 Needle insertion depth: 6 cm Catheter type: closed end flexible Catheter size: 19 Gauge Catheter at skin depth: 11 cm Test dose: negative and Other  Assessment Sensory level: T9 Events: blood not aspirated, injection not painful, no injection resistance, negative IV test and no paresthesia  Additional Notes Reason for block:procedure for pain

## 2014-07-03 NOTE — Progress Notes (Signed)
[employer] Interpreter [phone] on the phone to discuss pain management options

## 2014-07-04 LAB — CBC
HCT: 28.1 % — ABNORMAL LOW (ref 36.0–46.0)
Hemoglobin: 9.8 g/dL — ABNORMAL LOW (ref 12.0–15.0)
MCH: 27.9 pg (ref 26.0–34.0)
MCHC: 34.9 g/dL (ref 30.0–36.0)
MCV: 80.1 fL (ref 78.0–100.0)
Platelets: 173 10*3/uL (ref 150–400)
RBC: 3.51 MIL/uL — AB (ref 3.87–5.11)
RDW: 14.9 % (ref 11.5–15.5)
WBC: 18.4 10*3/uL — AB (ref 4.0–10.5)

## 2014-07-04 NOTE — Plan of Care (Signed)
Problem: Phase I Progression Outcomes Goal: Voiding adequately Outcome: Not Progressing No void, yet

## 2014-07-04 NOTE — Anesthesia Postprocedure Evaluation (Signed)
  Anesthesia Post-op Note  Patient: Christy Huff  Procedure(s) Performed: * No procedures listed *  Patient Location: Mother/Baby  Anesthesia Type:Epidural  Level of Consciousness: awake and patient cooperative  Airway and Oxygen Therapy: Patient Spontanous Breathing  Post-op Pain: none  Post-op Assessment: Post-op Vital signs reviewed, Patient's Cardiovascular Status Stable, Respiratory Function Stable, Patent Airway, No signs of Nausea or vomiting, Adequate PO intake, Pain level controlled, No headache, No backache and Patient able to bend at knees              Post-op Vital Signs: Reviewed and stable  Last Vitals:  Filed Vitals:   07/04/14 0440  BP: 121/77  Pulse: 99  Temp: 37.1 C  Resp: 18    Complications: No apparent anesthesia complications

## 2014-07-04 NOTE — Lactation Note (Signed)
This note was copied from the chart of Christy Pearl Sowder. Lactation Consultation Note  Patient Name: Christy Huff [id number] Date: 07/04/2014 Reason for consult: Initial assessment [employer] interpreter [phone] used for visit. Mom reports baby is nursing well. Baby sleepy at this visit and would not latch, placed STS. Basic teaching reviewed with Mom. Encouraged to BF 8-12 times in 24 hours and with feeding ques. Lactation brochure left for review, advised of OP services and support group. Encouraged to call for assist as desired.   Maternal Data    Feeding Feeding Type: Breast Fed Length of feed: 0 min  LATCH Score/Interventions Latch: Too sleepy or reluctant, no latch achieved, no sucking elicited.     Type of Nipple: Everted at rest and after stimulation  Comfort (Breast/Nipple): Soft / non-tender           Lactation Tools Discussed/Used WIC Program: Yes   Consult Status Consult Status: Follow-up Date: 07/05/14 Follow-up type: In-patient    Moranda, Rigoni 07/04/2014, 3:31 PM

## 2014-07-04 NOTE — Progress Notes (Signed)
Patient ID: Christy Huff, female   DOB: [birth year], 34 y.o.   MRN: [id number] Post Partum Day #1  Subjective:  Christy Huff is a 34 y.o. G1P1001 [redacted]w[redacted]d s/p VAVD with 3rd degree laceration.  No acute events overnight.  Pt denies problems with ambulating, voiding or po intake.  She denies nausea or vomiting.  Pain is well controlled.  She has had flatus. She has not had bowel movement.  Lochia Small.  Plan for birth control is no method.  Method of Feeding: breast  Objective: Blood pressure 121/77, pulse 99, temperature 98.8 F (37.1 C), temperature source Oral, resp. rate 18, height 4' 9.87" (1.47 m), weight 65.318 kg (144 lb), SpO2 85 %, unknown if currently breastfeeding.  Physical Exam:  General: alert, cooperative and no distress Lochia:normal flow, no active bleeding appreciated from laceration site Chest: CTAB Heart: RRR no m/r/g Abdomen: +BS, soft, nontender,  Uterine Fundus: firm DVT Evaluation: No evidence of DVT seen on physical exam. Extremities: No edema   Recent Labs  07/03/14 1736 07/03/14 2236  HGB 13.3 12.2  HCT 38.8 36.1    Assessment/Plan:  ASSESSMENT: Christy Huff is a 34 y.o. G1P1001 [redacted]w[redacted]d s/p VAVD with 3rd degree laceration -Cont pain control -Scheduled stool softeners -Repeat CBC this AM - immediate post-partum without significant drop in Hgb -Plan for d/c tomorrow   LOS: 2 days   Fabio Asa 07/04/2014, 7:48 AM

## 2014-07-04 NOTE — Progress Notes (Signed)
[employer] interpreter called and mother/baby care instructions given.  Family members present for teaching done on speaker phone. Patient indicated she understood instructions.

## 2014-07-04 NOTE — Progress Notes (Signed)
Using [facility] interpreter # [phone] to converse and instruct patient. Paperwork discussed, teaching started, questions answered.

## 2014-07-05 MED ORDER — IBUPROFEN 600 MG PO TABS: 600.0000 mg | ORAL_TABLET | Freq: Four times a day (QID) | ORAL | Status: DC | PRN

## 2014-07-05 NOTE — Lactation Note (Signed)
This note was copied from the chart of Christy Huff. Lactation Consultation Note  Patient Name: Christy Huff [id number] Date: 07/05/2014 Reason for consult: Follow-up assessment;Other (Comment) (LC with [facility] RN Jolayne Haines with patient and [employer] interpreter )  Mom Limited English - [place].  Baby has exclusivity breast fed during hospital stay. Per mom recently breast fed 10 mins per mom. LC reviewed basics - steps for latching , sore nipple and engorgement prevention and tx.  Per mom when abby is feeding hearing increased swallows, and comfortable. Per mom breast are feeling fuller. LC also referred to the Baby and me booklet, Patients friend reads english. LC instructed mom on the use hand pump , and had mom use to check for flange size , #24 Flange comfortable for today, increased to #27 Flange  For when the milk comes in.  Mother informed of post-discharge support and given phone number to the lactation department, including services for phone call assistance;  out-patient appointments; and breastfeeding support group. List of other breastfeeding resources in the community given in the handout.  Encouraged mother to call for problems or concerns related to breastfeeding.   Maternal Data Has patient been taught Hand Expression?: Yes  Feeding Feeding Type:  (per mom recently fed  10 mins ) Length of feed: 10 min  LATCH Score/Interventions                      Lactation Tools Discussed/Used Tools: Pump;Flanges Flange Size: 27 Breast pump type: Manual   Consult Status Consult Status: Complete Date: 07/05/14    Kathrin Greathouse 07/05/2014, 12:28 PM

## 2014-07-05 NOTE — Progress Notes (Addendum)
Patient support person speaks and understand [place].  Patient stated she feels comfortable with support person Percell Belt to translate.  PKU discussed with patient and support person.  Patient gave permission for support person to translate communication related to PKU, patient stated she understood.  Patient declined to have interpreter to be called for PKU.  Patient did stated that she would like to have interpreter to be called for new education other than PKU. This conversation was also discussed and witness by 3pm - 11pm RN Leta Baptist.  Patient stated she wants Percell Belt to translate and will notify RN will she would like too utilize interpreter. Interpreter was called [phone] to validate patient understood all night shift care and including PKU and to see if mom had any educational questions, mom declined having any questions and stated she understood per interpreter PKU test that was completed during shift, and was very satisfied with care.

## 2014-07-05 NOTE — Discharge Instructions (Signed)

## 2014-07-05 NOTE — Discharge Summary (Signed)
Obstetric Discharge Summary Reason for Admission: induction of labor for gHTN Prenatal Procedures: none Intrapartum Procedures: vacuum and GBS prophylaxis Postpartum Procedures: none Complications-Operative and Postpartum: hemorrhage and 3rd degree extensive vag lac HEMOGLOBIN  Date Value Ref Range Status  07/04/2014 9.8* 12.0 - 15.0 g/dL Final   HCT  Date Value Ref Range Status  07/04/2014 28.1* 36.0 - 46.0 % Final   Christy Huff is a 33yo G1 admitted in the morning of 6/20 @ 39.6wks for IOL due to gHTN. She underwent cx ripening w/ cytotec dosing and a eventually a foley balloon, followed by Pitocin. By the evening of 6/21 she was fully dilated and pushing and the need for a vacuum was decided upon. See Delivery Summary for details of shoulder dystocis and lac repair/hemorrhage due to blood loss during repair. Her Hgb just after delivery was 12.2 on PPD#1 was 9.8. By PPD#2 she was doing well and was deemed to have received the full benefit of her hospital stay. She was breastfeeding and was undecided re contraception.  Physical Exam:  General: alert, cooperative and no distress  Heart: RRR Lungs: nl effort Lochia: appropriate Uterine Fundus: firm DVT Evaluation: No evidence of DVT seen on physical exam.  Discharge Diagnoses: Term Pregnancy-delivered  Discharge Information: Date: 07/05/2014 Activity: pelvic rest Diet: routine Medications: PNV and Ibuprofen Condition: stable Instructions: refer to practice specific booklet Discharge to: home Follow-up Information    Follow up with [facility] [place]. Schedule an appointment as soon as possible for a visit in 6 weeks.   Why:  For your postpartum appointment.   Contact information:   [address] [place] [address] [phone]      Newborn Data: Live born female  Birth Weight: 7 lb 15.3 oz (3610 g) APGAR: 9, 9  Home with mother.  [name] CNM 07/05/2014, 8:01 AM

## 2014-07-05 NOTE — Progress Notes (Signed)
[employer] interpreter called to complete all mother-baby teaching. Family members and mother present for teaching on speaker phone. The patient indicated understanding of information given.

## 2016-06-03 IMAGING — US US OB COMP +14 WK
1 series · 12 of 28 positions shown · non-contrast
Comparison: none

[Series 1: us ob comp +14 wk mfm · 12 of 113 slices shown]
[im 5/113]
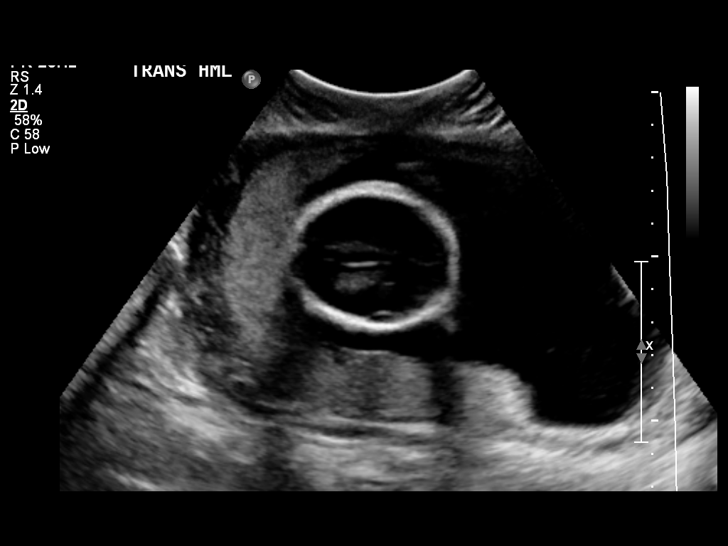
[im 13/113]
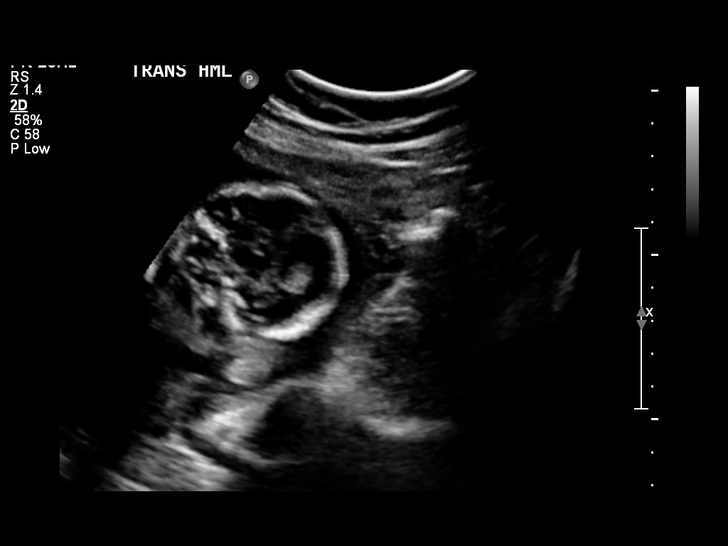
[im 21/113]
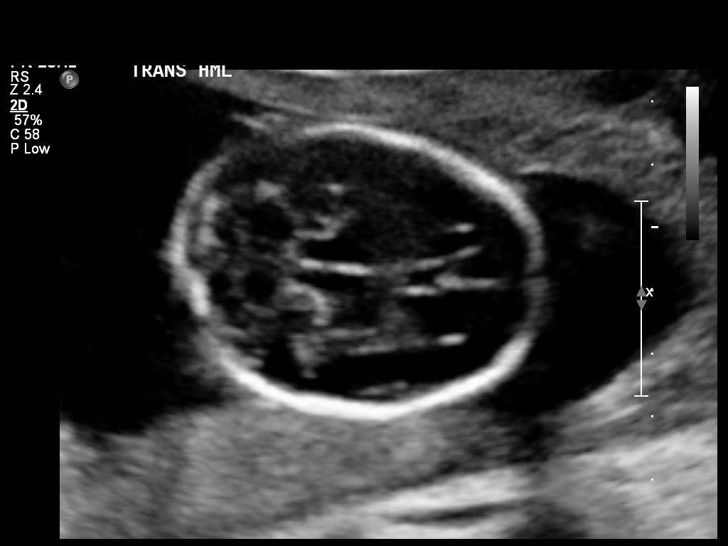
[im 34/113]
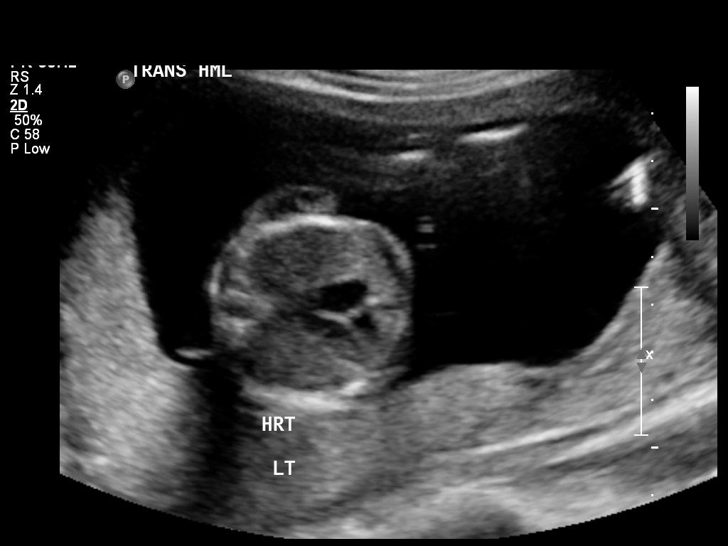
[im 42/113]
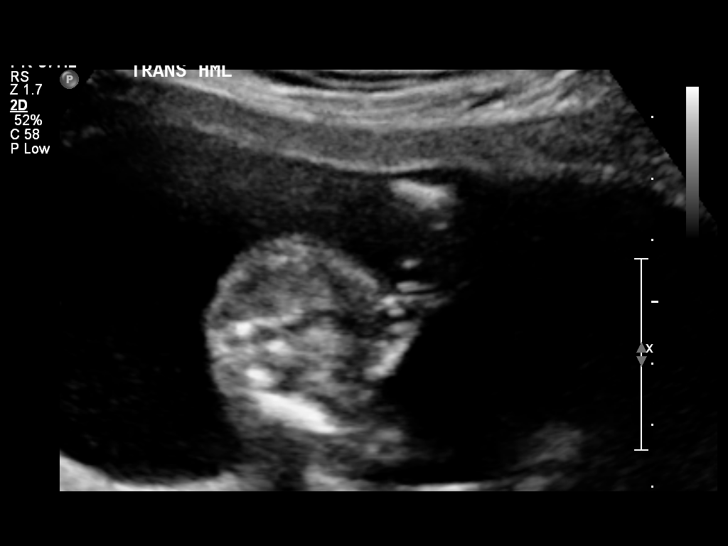
[im 50/113]
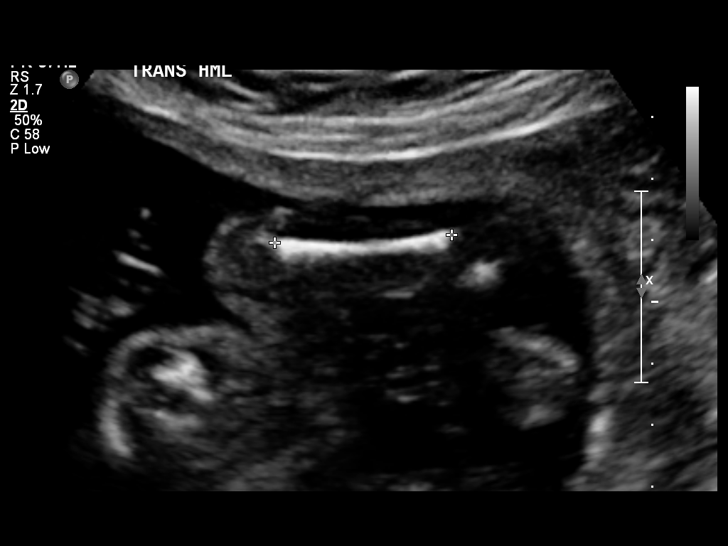
[im 63/113]
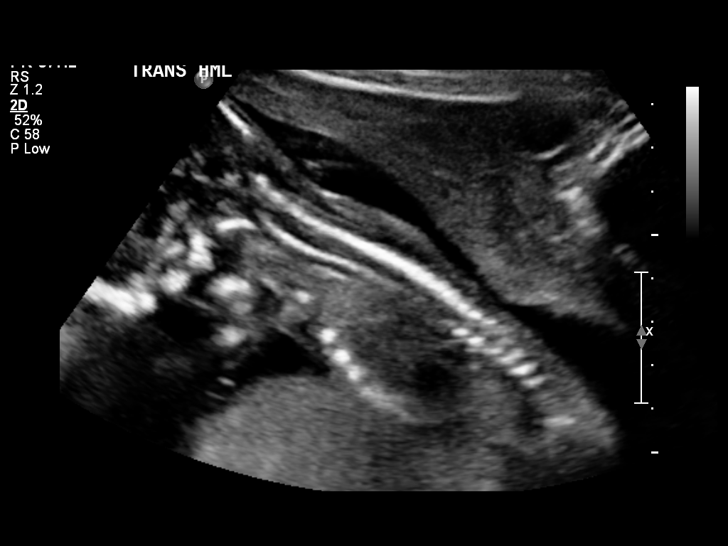
[im 71/113]
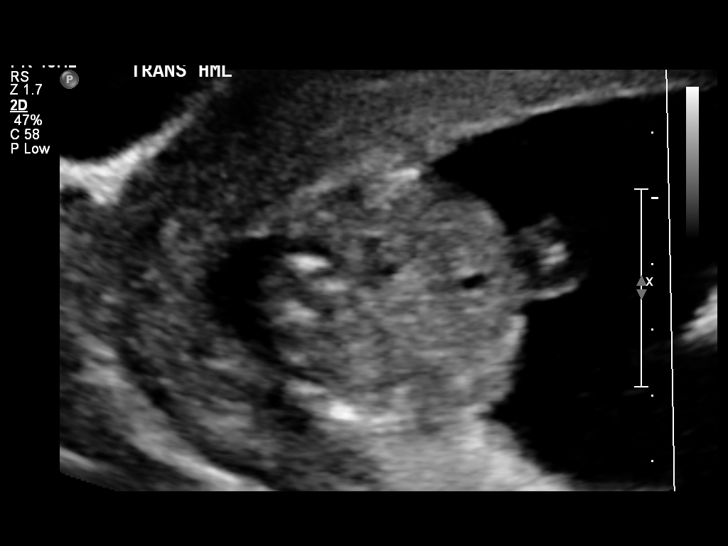
[im 79/113]
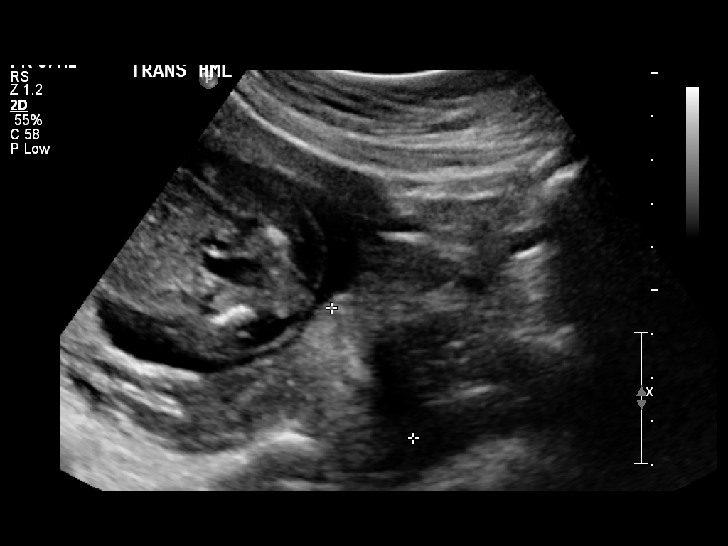
[im 92/113]
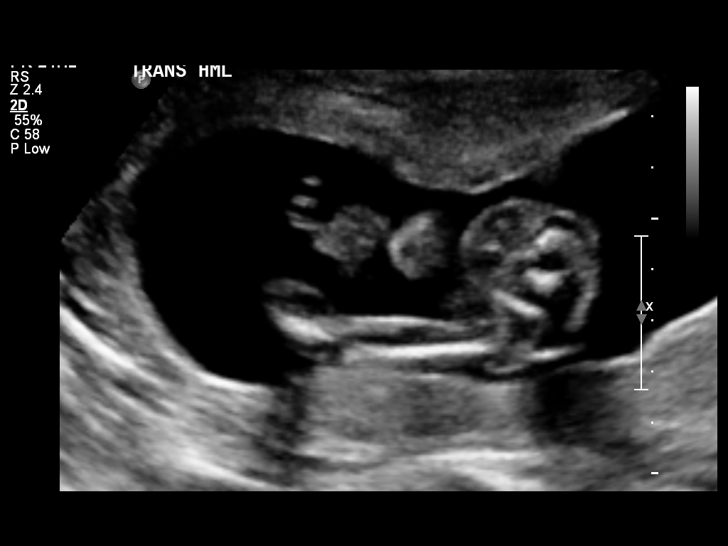
[im 100/113]
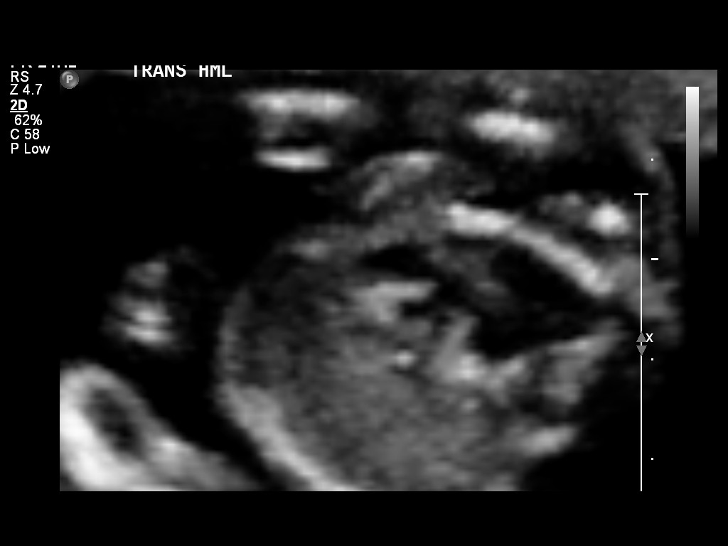
[im 108/113]
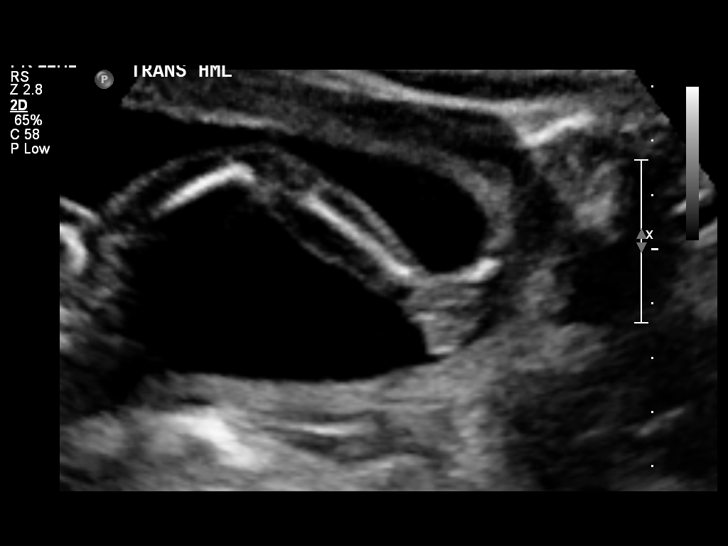

[12 of 28 positions shown; findings below may reference images not displayed]

OBSTETRICS REPORT
                      (Signed Final 02/08/2014 [DATE])

Service(s) Provided

 US OB COMP + 14 WK                                    76805.1
Indications

 19 weeks gestation of pregnancy
 Basic anatomic survey                                 [id number]
Fetal Evaluation

 Num Of Fetuses:    1
 Fetal Heart Rate:  132                          bpm
 Cardiac Activity:  Observed
 Presentation:      Transverse, head to
                    maternal left
 Placenta:          Posterior, above cervical
                    os
 P. Cord            Visualized, central
 Insertion:

 Amniotic Fluid
 AFI FV:      Subjectively within normal limits
                                             Larg Pckt:     6.4  cm
Biometry

 BPD:     43.9  mm     G. Age:  19w 2d                CI:        70.56   70 - 86
                                                      FL/HC:      17.4   16.1 -

 HC:     166.6  mm     G. Age:  19w 3d       46  %    HC/AC:      1.16   1.09 -

 AC:     143.9  mm     G. Age:  19w 5d       60  %    FL/BPD:
 FL:        29  mm     G. Age:  18w 6d       30  %    FL/AC:      20.2   20 - 24
 HUM:     28.7  mm     G. Age:  19w 2d       51  %
 CER:       17  mm     G. Age:  17w 0d      < 5  %
 NFT:      4.1  mm

 Est. FW:     287  gm    0 lb 10 oz      47  %
Gestational Age

 LMP:           19w 2d        Date:  09/26/13                 EDD:   07/03/14
 U/S Today:     19w 2d                                        EDD:   07/03/14
 Best:          19w 2d     Det. By:  LMP  (09/26/13)          EDD:   07/03/14
Anatomy

 Cranium:          Appears normal         Aortic Arch:      Appears normal
 Fetal Cavum:      Appears normal         Ductal Arch:      Appears normal
 Ventricles:       Appears normal         Diaphragm:        Appears normal
 Choroid Plexus:   Appears normal         Stomach:          Appears normal, left
                                                            sided
 Cerebellum:       Appears normal         Abdomen:          Appears normal
 Posterior Fossa:  Appears normal         Abdominal Wall:   Appears nml (cord
                                                            insert, abd wall)
 Nuchal Fold:      Appears normal         Cord Vessels:     Appears normal (3
                                                            vessel cord)
 Face:             Appears normal         Kidneys:          Appear normal
                   (orbits and profile)
 Lips:             Appears normal         Bladder:          Appears normal
 Heart:            Appears normal         Spine:            Appears normal
                   (4CH, axis, and
                   situs)
 RVOT:             Appears normal         Lower             Appears normal
                                          Extremities:
 LVOT:             Appears normal         Upper             Appears normal
                                          Extremities:

 Other:  Fetus appears to be a female. Nasal bone visualized. Heels and 5th
         digit visualized.
Targeted Anatomy

 Fetal Central Nervous System
 Lat. Ventricles:  6.3                    Cisterna Magna:
Cervix Uterus Adnexa

 Cervical Length:    3.52     cm

 Cervix:       Normal appearance by transabdominal scan.
 Uterus:       No abnormality visualized.

 Left Ovary:    Not visualized.
 Right Ovary:   Not visualized.
 Adnexa:     No adnexal mass visualized.
Impression

 Single IUP at 19w 2d
 Normal fetal anatomic survey
 No markers associated with aneuploidy noted
 Posterior placenta without previa
 Normal amniotic fluid volume
Recommendations

 Follow-up ultrasounds as clinically indicated.

 questions or concerns.

## 2017-01-12 NOTE — L&D Delivery Note (Signed)
Patient is 37 y.o. G2P1001 4074w3d admitted [facility]  Delivery Note At 4:21 AM a viable female was delivered via Vaginal, Spontaneous (Presentation: LOA;  ).  APGAR: 8, 9; weight  pending Placenta status: delivered, intact, trailing membranes.  Cord:  3V  Anesthesia:  Lidocaine Episiotomy: None Lacerations: 2nd degree Suture Repair: 3.0 moncryl Est. Blood Loss (mL): 75  Mom to postpartum.  Baby to Couplet care / Skin to Skin.  Upon arrival patient was complete and pushing. She pushed with good maternal effort to deliver a healthy baby girl. Baby delivered without difficulty, was noted to have good tone and place on maternal abdomen for oral suctioning, drying and stimulation. Delayed cord clamping performed. Placenta delivered intact with 3V cord. Vaginal canal and perineum was inspected and second degree repaired with 3.0 monocryl; hemostatic. Pitocin was started and uterus massaged until bleeding slowed. Counts of sharps, instruments, and lap pads were all correct.   Garnette GunnerAaron B Thompson, MD PGY-1 6/22/20195:11 AM

## 2017-03-04 ENCOUNTER — Other Ambulatory Visit (HOSPITAL_COMMUNITY): Payer: Self-pay | Admitting: Family

## 2017-03-04 DIAGNOSIS — Z36 Encounter for antenatal screening for chromosomal anomalies: Secondary | ICD-10-CM

## 2017-03-04 LAB — OB RESULTS CONSOLE ABO/RH: RH TYPE: POSITIVE

## 2017-03-04 LAB — OB RESULTS CONSOLE HIV ANTIBODY (ROUTINE TESTING): HIV: NONREACTIVE

## 2017-03-04 LAB — OB RESULTS CONSOLE ANTIBODY SCREEN: ANTIBODY SCREEN: NEGATIVE

## 2017-03-04 LAB — OB RESULTS CONSOLE HEPATITIS B SURFACE ANTIGEN: Hepatitis B Surface Ag: POSITIVE

## 2017-03-04 LAB — OB RESULTS CONSOLE RPR: RPR: NONREACTIVE

## 2017-03-04 LAB — OB RESULTS CONSOLE RUBELLA ANTIBODY, IGM: RUBELLA: IMMUNE

## 2017-03-04 LAB — OB RESULTS CONSOLE GC/CHLAMYDIA
CHLAMYDIA, DNA PROBE: NEGATIVE
GC PROBE AMP, GENITAL: NEGATIVE

## 2017-03-12 ENCOUNTER — Encounter (HOSPITAL_COMMUNITY): Payer: Self-pay | Admitting: *Deleted

## 2017-03-16 ENCOUNTER — Ambulatory Visit (HOSPITAL_COMMUNITY)
Admission: RE | Admit: 2017-03-16 | Discharge: 2017-03-16 | Disposition: A | Discharge status: Home / Self Care | Payer: Self-pay | Source: Ambulatory Visit | Attending: Family | Admitting: Family

## 2017-03-16 ENCOUNTER — Other Ambulatory Visit (HOSPITAL_COMMUNITY): Payer: Self-pay | Admitting: *Deleted

## 2017-03-16 ENCOUNTER — Encounter (HOSPITAL_COMMUNITY): Payer: Self-pay

## 2017-03-16 DIAGNOSIS — O09523 Supervision of elderly multigravida, third trimester: Secondary | ICD-10-CM

## 2017-03-16 DIAGNOSIS — Z3A24 24 weeks gestation of pregnancy: Secondary | ICD-10-CM | POA: Insufficient documentation

## 2017-03-16 DIAGNOSIS — Z36 Encounter for antenatal screening for chromosomal anomalies: Secondary | ICD-10-CM | POA: Insufficient documentation

## 2017-03-16 DIAGNOSIS — O09522 Supervision of elderly multigravida, second trimester: Secondary | ICD-10-CM

## 2017-03-16 DIAGNOSIS — Z3689 Encounter for other specified antenatal screening: Secondary | ICD-10-CM | POA: Insufficient documentation

## 2017-03-16 DIAGNOSIS — Z3490 Encounter for supervision of normal pregnancy, unspecified, unspecified trimester: Secondary | ICD-10-CM | POA: Insufficient documentation

## 2017-03-16 NOTE — Progress Notes (Signed)
Genetic Counseling  High-Risk Gestation Note  Appointment Date:  03/16/2017 Referred By: Christy Huff* Date of Birth:  1980-05-12   Pregnancy History: G2P1001 Estimated Date of Delivery: 06/30/17  Estimated Gestational Age: [redacted]w[redacted]d Attending: Damaris Hippo, MD   Ms. Christy Huff was seen for genetic counseling because of a maternal age of 37 y.o.. Christy Huff/English interpreter, Christy Huff, from [facility] provided interpretation for today's visit.     In summary:  Discussed AMA and associated risk for fetal aneuploidy  Discussed options for screening  NIPS- declined  Ultrasound- performed today; EDC changed to 06/30/17 based on ultrasound dating  Quad screen previously performed, drawn too late in gestation based on new Christy Huff  Discussed diagnostic testing options  Amniocentesis- declined  Reviewed family history concerns  Discussed carrier screening options- declined  CF  SMA  Hemoglobinopathies  She was counseled regarding maternal age and the association with risk for chromosome conditions due to nondisjunction with aging of the ova.   We reviewed chromosomes, nondisjunction, and the associated 1 in 111 risk for fetal aneuploidy related to a maternal age of 37 y.o. at [redacted]w[redacted]d gestation.  She was counseled that the risk for aneuploidy decreases as gestational age increases, accounting for those pregnancies which spontaneously abort.  We specifically discussed Down syndrome (trisomy 90), trisomies 44 and 32, and sex chromosome aneuploidies (47,XXX and 47,XXY) including the common features and prognoses of each.   Detailed ultrasound was performed today. Visualized fetal anatomy was within normal limits. Complete ultrasound results under separate report. Based on ultrasound measurements, new EDC of 06/30/17. Given the updated [facility] by today's ultrasound, the patient's previously performed Quad screen was too late in gestation for accurate interpretation.   We reviewed available screening  options including noninvasive prenatal screening (NIPS)/cell free DNA (cfDNA) screening, and detailed ultrasound.  She was counseled that screening tests are used to modify a patient's a priori risk for aneuploidy, typically based on age. This estimate provides a pregnancy specific risk assessment. We reviewed the benefits and limitations of each option. Specifically, we discussed the conditions for which each test screens, the detection rates, and false positive rates of each. She was also counseled regarding diagnostic testing via amniocentesis. We reviewed the approximate 1 in 300-500 risk for complications from amniocentesis, including spontaneous pregnancy loss. We discussed the possible results that the tests might provide including: positive, negative, unanticipated, and no result. Finally, they were counseled regarding the cost of each option and potential out of pocket expenses.  After consideration of all the options, she declined NIPS and amniocentesis. She understands that screening tests cannot rule out all birth defects or genetic syndromes. The patient was advised of this limitation and states she still does not want additional testing at this time.   Ms. Christy Huff was provided with written information regarding cystic fibrosis (CF), spinal muscular atrophy (SMA) and hemoglobinopathies including the carrier frequency, availability of carrier screening and prenatal diagnosis if indicated.  In addition, we discussed that CF and hemoglobinopathies are routinely screened for as part of the [place] newborn screening panel.  After further discussion, she declined screening for CF, SMA and hemoglobinopathies.  Both family histories were reviewed and found to be noncontributory for birth defects, intellectual disability, and known genetic conditions. Consanguinity was denied. Without further information regarding the provided family history, an accurate genetic risk cannot be calculated. Further genetic  counseling is warranted if more information is obtained.  The father of the pregnancy is reportedly 79 years old. Advanced paternal age (  APA) is defined as paternal age greater than or equal to age 37.  Recent large-scale sequencing studies have shown that approximately 80% of de novo point mutations are of paternal origin.  Many studies have demonstrated a strong correlation between increased paternal age and de novo point mutations.  Although no specific data is available regarding fetal risks for fathers 3845+ years old at conception, it is apparent that the overall risk for single gene conditions is increased.  To estimate the relative increase in risk of a genetic disorder with APA, the heritability of the disease must be considered.  Assuming an approximate 2x increase in risk for conditions that are exclusively paternal in origin, the risk for each individual condition is still relatively low.  It is estimated that the overall chance for a de novo mutation is ~0.5%.  We also discussed the wide range of conditions which can be caused by new dominant gene mutations (achondroplasia, neurofibromatosis, Marfan syndrome etc.).      They were counseled that diagnostic testing for each individual single gene condition is not warranted or available unless ultrasound or concerns lend suspicion to a specific condition. However, there is another NIPS platform ([employer] through [employer]) that is able to assess for specific mutations in a panel of 30 selected genes covering 26 conditions. Most of these conditions follow an autosomal dominant pattern of inheritance and typically occur due to de novo gene mutations. The detection rates for these conditions vary depending upon the specific condition but range from 43% to 96%. Therefore, this screening would not identify all new dominant gene mutations. Ms. Christy Huff declined additional screening with [employer].  Ms. Christy Huff denied exposure to environmental toxins or chemical  agents. She denied the use of alcohol, tobacco or street drugs. She denied significant viral illnesses during the course of her pregnancy. Her medical and surgical histories were noncontributory.   I counseled Ms. Aidan Velasquez regarding the above risks and available options.  The approximate face-to-face time with the genetic counselor was 35 minutes.  Quinn PlowmanKaren Reily Treloar, MS,  Certified Genetic Counselor 03/16/2017

## 2017-03-17 ENCOUNTER — Other Ambulatory Visit: Payer: Self-pay

## 2017-03-18 DIAGNOSIS — Z3A25 25 weeks gestation of pregnancy: Secondary | ICD-10-CM | POA: Insufficient documentation

## 2017-04-27 ENCOUNTER — Ambulatory Visit (HOSPITAL_COMMUNITY)
Admission: RE | Admit: 2017-04-27 | Discharge: 2017-04-27 | Disposition: A | Discharge status: Home / Self Care | Payer: Medicaid Other | Source: Ambulatory Visit | Attending: Family | Admitting: Family

## 2017-04-27 ENCOUNTER — Other Ambulatory Visit (HOSPITAL_COMMUNITY): Payer: Self-pay | Admitting: Obstetrics and Gynecology

## 2017-04-27 ENCOUNTER — Encounter (HOSPITAL_COMMUNITY): Payer: Self-pay

## 2017-04-27 DIAGNOSIS — Z315 Encounter for genetic counseling: Secondary | ICD-10-CM

## 2017-04-27 DIAGNOSIS — Z3A30 30 weeks gestation of pregnancy: Secondary | ICD-10-CM

## 2017-04-27 DIAGNOSIS — Z362 Encounter for other antenatal screening follow-up: Secondary | ICD-10-CM | POA: Diagnosis present

## 2017-04-27 DIAGNOSIS — O09523 Supervision of elderly multigravida, third trimester: Secondary | ICD-10-CM | POA: Insufficient documentation

## 2017-04-27 NOTE — ED Notes (Signed)
[employer] interpreter [phone] used during visit.

## 2017-06-03 LAB — OB RESULTS CONSOLE GC/CHLAMYDIA
CHLAMYDIA, DNA PROBE: NEGATIVE
Gonorrhea: NEGATIVE

## 2017-06-03 LAB — OB RESULTS CONSOLE GBS: GBS: NEGATIVE

## 2017-06-08 ENCOUNTER — Ambulatory Visit (HOSPITAL_COMMUNITY)
Admission: RE | Admit: 2017-06-08 | Discharge: 2017-06-08 | Disposition: A | Discharge status: Home / Self Care | Payer: Medicaid Other | Source: Ambulatory Visit | Attending: Family | Admitting: Family

## 2017-07-01 ENCOUNTER — Telehealth (HOSPITAL_COMMUNITY): Payer: Self-pay | Admitting: *Deleted

## 2017-07-01 ENCOUNTER — Encounter (HOSPITAL_COMMUNITY): Payer: Self-pay | Admitting: *Deleted

## 2017-07-01 NOTE — Telephone Encounter (Signed)
Preadmission screen Interpreter number [phone]

## 2017-07-02 ENCOUNTER — Encounter (HOSPITAL_COMMUNITY): Payer: Self-pay

## 2017-07-02 ENCOUNTER — Inpatient Hospital Stay (HOSPITAL_COMMUNITY)
Admission: AD | Admit: 2017-07-02 | Discharge: 2017-07-03 | Disposition: A | Discharge status: Home / Self Care | Payer: Medicaid Other | Source: Ambulatory Visit | Attending: Obstetrics and Gynecology | Admitting: Obstetrics and Gynecology

## 2017-07-02 DIAGNOSIS — O479 False labor, unspecified: Secondary | ICD-10-CM

## 2017-07-02 NOTE — MAU Note (Signed)
Having contractions all day. Some bloody show but denies LOF.

## 2017-07-02 NOTE — MAU Note (Signed)
I have communicated with Dr. Janee Mornhompson and reviewed vital signs:  Vitals:   07/02/17 2221  BP: 126/79  Pulse: 80  Resp: 18  Temp: 97.6 F (36.4 C)    Vaginal exam:  Dilation: 3 Effacement (%): 80 Station: -2 Presentation: Vertex Exam by:: Latricia HeftAnna Yovani Cogburn, RN,   Also reviewed contraction pattern and that non-stress test is reactive.  It has been documented that patient is contracting every 2-7 minutes with minimal cervical change over 1 hour, not indicating active labor.  Patient denies any other complaints.  Based on this report provider has given order for discharge.  A discharge order and diagnosis entered by a provider.   Labor discharge instructions reviewed with patient.

## 2017-07-02 NOTE — Discharge Instructions (Signed)
Braxton Hicks Contractions °Contractions of the uterus can occur throughout pregnancy, but they are not always a sign that you are in labor. You may have practice contractions called Braxton Hicks contractions. These false labor contractions are sometimes confused with true labor. °What are Braxton Hicks contractions? °Braxton Hicks contractions are tightening movements that occur in the muscles of the uterus before labor. Unlike true labor contractions, these contractions do not result in opening (dilation) and thinning of the cervix. Toward the end of pregnancy (32-34 weeks), Braxton Hicks contractions can happen more often and may become stronger. These contractions are sometimes difficult to tell apart from true labor because they can be very uncomfortable. You should not feel embarrassed if you go to the hospital with false labor. °Sometimes, the only way to tell if you are in true labor is for your health care provider to look for changes in the cervix. The health care provider will do a physical exam and may monitor your contractions. If you are not in true labor, the exam should show that your cervix is not dilating and your water has not broken. °If there are other health problems associated with your pregnancy, it is completely safe for you to be sent home with false labor. You may continue to have Braxton Hicks contractions until you go into true labor. °How to tell the difference between true labor and false labor °True labor °· Contractions last 30-70 seconds. °· Contractions become very regular. °· Discomfort is usually felt in the top of the uterus, and it spreads to the lower abdomen and low back. °· Contractions do not go away with walking. °· Contractions usually become more intense and increase in frequency. °· The cervix dilates and gets thinner. °False labor °· Contractions are usually shorter and not as strong as true labor contractions. °· Contractions are usually irregular. °· Contractions  are often felt in the front of the lower abdomen and in the groin. °· Contractions may go away when you walk around or change positions while lying down. °· Contractions get weaker and are shorter-lasting as time goes on. °· The cervix usually does not dilate or become thin. °Follow these instructions at home: °· Take over-the-counter and prescription medicines only as told by your health care provider. °· Keep up with your usual exercises and follow other instructions from your health care provider. °· Eat and drink lightly if you think you are going into labor. °· If Braxton Hicks contractions are making you uncomfortable: °? Change your position from lying down or resting to walking, or change from walking to resting. °? Sit and rest in a tub of warm water. °? Drink enough fluid to keep your urine pale yellow. Dehydration may cause these contractions. °? Do slow and deep breathing several times an hour. °· Keep all follow-up prenatal visits as told by your health care provider. This is important. °Contact a health care provider if: °· You have a fever. °· You have continuous pain in your abdomen. °Get help right away if: °· Your contractions become stronger, more regular, and closer together. °· You have fluid leaking or gushing from your vagina. °· You pass blood-tinged mucus (bloody show). °· You have bleeding from your vagina. °· You have low back pain that you never had before. °· You feel your baby’s head pushing down and causing pelvic pressure. °· Your baby is not moving inside you as much as it used to. °Summary °· Contractions that occur before labor are called Braxton   Hicks contractions, false labor, or practice contractions. °· Braxton Hicks contractions are usually shorter, weaker, farther apart, and less regular than true labor contractions. True labor contractions usually become progressively stronger and regular and they become more frequent. °· Manage discomfort from Braxton Hicks contractions by  changing position, resting in a warm bath, drinking plenty of water, or practicing deep breathing. °This information is not intended to replace advice given to you by your health care provider. Make sure you discuss any questions you have with your health care provider. °Document Released: 05/14/2016 Document Revised: 05/14/2016 Document Reviewed: 05/14/2016 °Elsevier Interactive Patient Education © 2018 [employer] ° °

## 2017-07-03 ENCOUNTER — Encounter (HOSPITAL_COMMUNITY): Payer: Self-pay | Admitting: General Practice

## 2017-07-03 ENCOUNTER — Inpatient Hospital Stay (HOSPITAL_COMMUNITY)
Admission: AD | Admit: 2017-07-03 | Discharge: 2017-07-05 | DRG: 806 | Disposition: A | Discharge status: Home / Self Care | Payer: Medicaid Other | Attending: Obstetrics and Gynecology | Admitting: Obstetrics and Gynecology

## 2017-07-03 ENCOUNTER — Other Ambulatory Visit: Payer: Self-pay

## 2017-07-03 DIAGNOSIS — O09522 Supervision of elderly multigravida, second trimester: Secondary | ICD-10-CM | POA: Diagnosis present

## 2017-07-03 DIAGNOSIS — O134 Gestational [pregnancy-induced] hypertension without significant proteinuria, complicating childbirth: Secondary | ICD-10-CM | POA: Diagnosis present

## 2017-07-03 DIAGNOSIS — Z3A40 40 weeks gestation of pregnancy: Secondary | ICD-10-CM

## 2017-07-03 DIAGNOSIS — B181 Chronic viral hepatitis B without delta-agent: Secondary | ICD-10-CM | POA: Diagnosis present

## 2017-07-03 DIAGNOSIS — O9842 Viral hepatitis complicating childbirth: Principal | ICD-10-CM | POA: Diagnosis present

## 2017-07-03 DIAGNOSIS — Z3483 Encounter for supervision of other normal pregnancy, third trimester: Secondary | ICD-10-CM | POA: Diagnosis present

## 2017-07-03 LAB — CBC
HCT: 39.6 % (ref 36.0–46.0)
HEMOGLOBIN: 13.5 g/dL (ref 12.0–15.0)
MCH: 27.2 pg (ref 26.0–34.0)
MCHC: 34.1 g/dL (ref 30.0–36.0)
MCV: 79.8 fL (ref 78.0–100.0)
PLATELETS: 225 10*3/uL (ref 150–400)
RBC: 4.96 MIL/uL (ref 3.87–5.11)
RDW: 13.7 % (ref 11.5–15.5)
WBC: 10.6 10*3/uL — ABNORMAL HIGH (ref 4.0–10.5)

## 2017-07-03 LAB — TYPE AND SCREEN
ABO/RH(D): A POS
Antibody Screen: NEGATIVE

## 2017-07-03 LAB — RPR: RPR: NONREACTIVE

## 2017-07-03 MED ORDER — FENTANYL CITRATE (PF) 100 MCG/2ML IJ SOLN: 100.0000 ug | INTRAMUSCULAR | Status: DC | PRN

## 2017-07-03 MED ORDER — LACTATED RINGERS IV SOLN
500.0000 mL | INTRAVENOUS | Status: DC | PRN
  Administered 2017-07-03: 500 mL via INTRAVENOUS

## 2017-07-03 MED ORDER — DIPHENHYDRAMINE HCL 25 MG PO CAPS: 25.0000 mg | ORAL_CAPSULE | Freq: Four times a day (QID) | ORAL | Status: DC | PRN

## 2017-07-03 MED ORDER — FENTANYL CITRATE (PF) 100 MCG/2ML IJ SOLN
INTRAMUSCULAR | Status: AC
  Filled 2017-07-03: qty 2

## 2017-07-03 MED ORDER — ACETAMINOPHEN 325 MG PO TABS: 650.0000 mg | ORAL_TABLET | ORAL | Status: DC | PRN

## 2017-07-03 MED ORDER — PRENATAL MULTIVITAMIN CH
1.0000 | ORAL_TABLET | Freq: Every day | ORAL | Status: DC
  Administered 2017-07-03 – 2017-07-05 (×3): 1 via ORAL
  Filled 2017-07-03 (×3): qty 1

## 2017-07-03 MED ORDER — OXYCODONE-ACETAMINOPHEN 5-325 MG PO TABS: 1.0000 | ORAL_TABLET | ORAL | Status: DC | PRN

## 2017-07-03 MED ORDER — OXYCODONE HCL 5 MG PO TABS: 5.0000 mg | ORAL_TABLET | ORAL | Status: DC | PRN

## 2017-07-03 MED ORDER — BENZOCAINE-MENTHOL 20-0.5 % EX AERO
1.0000 "application " | INHALATION_SPRAY | CUTANEOUS | Status: DC | PRN
  Administered 2017-07-03: 1 via TOPICAL
  Filled 2017-07-03: qty 56

## 2017-07-03 MED ORDER — TETANUS-DIPHTH-ACELL PERTUSSIS 5-2.5-18.5 LF-MCG/0.5 IM SUSP: 0.5000 mL | Freq: Once | INTRAMUSCULAR | Status: DC

## 2017-07-03 MED ORDER — COCONUT OIL OIL: 1.0000 "application " | TOPICAL_OIL | Status: DC | PRN

## 2017-07-03 MED ORDER — LIDOCAINE HCL (PF) 1 % IJ SOLN
30.0000 mL | INTRAMUSCULAR | Status: AC | PRN
  Administered 2017-07-03: 30 mL via SUBCUTANEOUS
  Filled 2017-07-03: qty 30

## 2017-07-03 MED ORDER — ZOLPIDEM TARTRATE 5 MG PO TABS: 5.0000 mg | ORAL_TABLET | Freq: Every evening | ORAL | Status: DC | PRN

## 2017-07-03 MED ORDER — OXYCODONE HCL 5 MG PO TABS: 10.0000 mg | ORAL_TABLET | ORAL | Status: DC | PRN

## 2017-07-03 MED ORDER — WITCH HAZEL-GLYCERIN EX PADS: 1.0000 "application " | MEDICATED_PAD | CUTANEOUS | Status: DC | PRN

## 2017-07-03 MED ORDER — POLYETHYLENE GLYCOL 3350 17 G PO PACK
17.0000 g | PACK | Freq: Every day | ORAL | Status: DC
  Administered 2017-07-04 – 2017-07-05 (×2): 17 g via ORAL
  Filled 2017-07-03 (×4): qty 1

## 2017-07-03 MED ORDER — ONDANSETRON HCL 4 MG/2ML IJ SOLN: 4.0000 mg | Freq: Four times a day (QID) | INTRAMUSCULAR | Status: DC | PRN

## 2017-07-03 MED ORDER — OXYTOCIN 40 UNITS IN LACTATED RINGERS INFUSION - SIMPLE MED
INTRAVENOUS | Status: AC
  Administered 2017-07-03: 500 mL via INTRAVENOUS
  Filled 2017-07-03: qty 1000

## 2017-07-03 MED ORDER — OXYCODONE-ACETAMINOPHEN 5-325 MG PO TABS: 2.0000 | ORAL_TABLET | ORAL | Status: DC | PRN

## 2017-07-03 MED ORDER — ONDANSETRON HCL 4 MG/2ML IJ SOLN: 4.0000 mg | INTRAMUSCULAR | Status: DC | PRN

## 2017-07-03 MED ORDER — SENNOSIDES-DOCUSATE SODIUM 8.6-50 MG PO TABS
2.0000 | ORAL_TABLET | ORAL | Status: DC
  Administered 2017-07-04 – 2017-07-05 (×2): 2 via ORAL
  Filled 2017-07-03 (×2): qty 2

## 2017-07-03 MED ORDER — SOD CITRATE-CITRIC ACID 500-334 MG/5ML PO SOLN: 30.0000 mL | ORAL | Status: DC | PRN

## 2017-07-03 MED ORDER — ONDANSETRON HCL 4 MG PO TABS
4.0000 mg | ORAL_TABLET | ORAL | Status: DC | PRN
Start: 2017-07-03 — End: 2017-07-05

## 2017-07-03 MED ORDER — LIDOCAINE HCL (PF) 1 % IJ SOLN
INTRAMUSCULAR | Status: AC
  Administered 2017-07-03: 30 mL via SUBCUTANEOUS
  Filled 2017-07-03: qty 30

## 2017-07-03 MED ORDER — OXYTOCIN BOLUS FROM INFUSION
500.0000 mL | Freq: Once | INTRAVENOUS | Status: AC
  Administered 2017-07-03: 500 mL via INTRAVENOUS

## 2017-07-03 MED ORDER — SIMETHICONE 80 MG PO CHEW: 80.0000 mg | CHEWABLE_TABLET | ORAL | Status: DC | PRN

## 2017-07-03 MED ORDER — LACTATED RINGERS IV SOLN
INTRAVENOUS | Status: DC
  Administered 2017-07-03: 03:00:00 via INTRAVENOUS

## 2017-07-03 MED ORDER — OXYTOCIN 40 UNITS IN LACTATED RINGERS INFUSION - SIMPLE MED: 2.5000 [IU]/h | INTRAVENOUS | Status: DC

## 2017-07-03 MED ORDER — IBUPROFEN 600 MG PO TABS
600.0000 mg | ORAL_TABLET | Freq: Four times a day (QID) | ORAL | Status: DC
  Administered 2017-07-03 – 2017-07-05 (×8): 600 mg via ORAL
  Filled 2017-07-03 (×8): qty 1

## 2017-07-03 MED ORDER — DIBUCAINE 1 % RE OINT
1.0000 "application " | TOPICAL_OINTMENT | RECTAL | Status: DC | PRN
  Administered 2017-07-05: 1 via RECTAL
  Filled 2017-07-03: qty 28

## 2017-07-03 NOTE — H&P (Addendum)
LABOR AND DELIVERY ADMISSION HISTORY AND PHYSICAL NOTE  Christy Huff is a 37 y.o. female G2P1001 with IUP at [redacted]w[redacted]d by LMP presenting in  Active labor s/p SROM at home.   She reports positive fetal movement. Reports leakage of fluid at 0200AM. Denies vaginal bleeding, CP, SOB.   Prenatal History/Complications: Limited PNC Location: HD Chronic Hep B H/o GHTN AMA Borderline AFP with MFM referral. Normal anatomy scans.   Past Medical History: Past Medical History:  Diagnosis Date  . Hepatitis     Past Surgical History: Past Surgical History:  Procedure Laterality Date  . NO PAST SURGERIES      Obstetrical History: OB History    Gravida  2   Para  1   Term  1   Preterm  0   AB  0   Living  1     SAB  0   TAB  0   Ectopic  0   Multiple  0   Live Births  1           Social History: Social History   Socioeconomic History  . Marital status: Married    Spouse name: Not on file  . Number of children: Not on file  . Years of education: Not on file  . Highest education level: Not on file  Occupational History  . Not on file  Social Needs  . Financial resource strain: Not on file  . Food insecurity:    Worry: Not on file    Inability: Not on file  . Transportation needs:    Medical: Not on file    Non-medical: Not on file  Tobacco Use  . Smoking status: Never Smoker  . Smokeless tobacco: Never Used  Substance and Sexual Activity  . Alcohol use: No  . Drug use: No  . Sexual activity: Yes  Lifestyle  . Physical activity:    Days per week: Not on file    Minutes per session: Not on file  . Stress: Not on file  Relationships  . Social connections:    Talks on phone: Not on file    Gets together: Not on file    Attends religious service: Not on file    Active member of club or organization: Not on file    Attends meetings of clubs or organizations: Not on file    Relationship status: Not on file  Other Topics Concern  . Not on file  Social  History Narrative  . Not on file    Family History: History reviewed. No pertinent family history.  Allergies: No Known Allergies  Medications Prior to Admission  Medication Sig Dispense Refill Last Dose  . ibuprofen (ADVIL,MOTRIN) 600 MG tablet Take 1 tablet (600 mg total) by mouth every 6 (six) hours as needed. (Patient not taking: Reported on 03/16/2017) 50 tablet 0 Not Taking  . Prenatal Vit-Fe Fumarate-FA (PRENATAL MULTIVITAMIN) TABS tablet Take 1 tablet by mouth daily at 12 noon.   Taking     Review of Systems   All systems reviewed and negative except as stated in HPI  Physical Exam Blood pressure 110/77, pulse 88, temperature 97.6 F (36.4 C), temperature source Oral, resp. rate (!) 24, height 4\' 9"  (1.448 m), weight 144 lb (65.3 kg), last menstrual period 10/04/2016, unknown if currently breastfeeding. General appearance: alert, cooperative and no distress Lungs: no respiratory distress Heart: regular rate Abdomen: soft, non-tender Extremities: No calf swelling or tenderness Presentation: cephalic by exam Fetal monitoring: BL 150,  mod var, reactive, no decel Uterine activity: 2-4 min ctx Dilation: 9 Effacement (%): 100 Station: 0 Exam by:: Dr Janee Mornhompson   Prenatal labs: ABO, Rh: A/Positive/-- (02/21 0000) Antibody: Negative (02/21 0000) Rubella: Immune RPR: Nonreactive (02/21 0000)  HBsAg: Positive (02/21 0000)  HIV: Non-reactive (02/21 0000)  GBS: Negative (05/23 0000)  Genetic screening:  Neclined Anatomy US: Normal at   Prenatal Transfer Tool  Maternal Diabetes: No Genetic Screening: Declined Maternal Ultrasounds/Referrals: Normal Fetal Ultrasounds or other Referrals:  Referred to Materal Fetal Medicine  Maternal Substance Abuse:  No Significant Maternal Medications:  None Significant Maternal Lab Results: Lab values include: Group B Strep negative, HBsAG positive  No results found for this or any previous visit (from the past 24 hour(s)).  Patient  Active Problem List   Diagnosis Date Noted  . Normal labor 07/03/2017  . [redacted] weeks gestation of pregnancy   . Risk of fetal chromosome anomaly affecting antepartum care of mother   . Advanced maternal age in multigravida, second trimester   . [redacted] weeks gestation of pregnancy   . Current pregnancy with uncertain date of last menstrual period with normal prior pregnancy   . Encounter for fetal anatomic survey   . Gestational hypertension 07/02/2014    Assessment: Christy Huff is a 37 y.o. G2P1001 at 4027w3d here for SOL with SROM  #Labor:active labor, expectant mgmt #Pain: NO #FWB: Cat I #ID:  GBS neg #MOF: Bottle #MOC:Condoms #Circ:  N/A  Garnette GunnerAaron B Thompson, MD PGY-1 6/22/20193:07 AM  CNM attestation:  I have seen and examined this patient; I agree with above documentation in the resident's note.   Christy Huff is a 37 y.o. G2P1001 here for SROM/SOL  PE: BP 111/77 (BP Location: Right Arm)   Pulse 90   Temp 98.4 F (36.9 C) (Oral)   Resp 18   Ht 4\' 9"  (1.448 m)   Wt 65.3 kg (144 lb)   LMP 10/04/2016   BMI 31.16 kg/m   Resp: normal effort, no distress Abd: gravid  ROS, labs, PMH reviewed  [facility] Expectant management Anticipate SVD Make peds aware of chronic Hep B status  SHAW, KIMBERLY CNM 07/03/2017, 9:51 AM

## 2017-07-03 NOTE — Progress Notes (Signed)
[employer] Language Interpreter, Nhat [phone], used for education, assessment, discuss plan of care today, order meals and for patient to ask questions.

## 2017-07-03 NOTE — Progress Notes (Signed)
Interpreter [phone] used to order breakfast for tomorrow morning, also for [place], to document infant's feedings, and discuss plan of care. Pt has no questions.

## 2017-07-04 NOTE — Progress Notes (Signed)
Family members brought food from home for patient. One of the family members speaks [place] and meals discussed. We have been using a [employer] Interpreter for meal selections and orders. Patient were concerned that meals were an additional to charge. Clarified the patient's concern with patient and family. English speaking family member will assist patient and call down meals and snacks for today.

## 2017-07-04 NOTE — Progress Notes (Signed)
POSTPARTUM PROGRESS NOTE  Post Partum Day 1  Subjective:  Christy Huff is a 37 y.o. G2P1001 s/p SVD at 7415w4d.  No acute events overnight.  Pt denies problems with ambulating, voiding or po intake.  She denies nausea or vomiting.  Pain is well controlled.  She has had flatus. She has not had bowel movement.  Lochia Small.   Objective: Blood pressure (!) 109/58, pulse (!) 52, temperature 98.1 F (36.7 C), temperature source Oral, resp. rate 16, height 4\' 9"  (1.448 m), weight 144 lb (65.3 kg), last menstrual period 10/04/2016, SpO2 98 %, unknown if currently breastfeeding.  Physical Exam:  General: alert, cooperative and no distress Chest: no respiratory distress Heart:regular rate, distal pulses intact Abdomen: soft, nontender,  Uterine Fundus: firm, appropriately tender DVT Evaluation: No calf swelling or tenderness Extremities: No edema Skin: warm, dry, intact  Recent Labs    07/03/17 0245  HGB 13.5  HCT 39.6    Assessment/Plan: Christy Huff is a 37 y.o. G2P1001 s/p SVD at 7615w4d   PPD#1 - Doing well Contraception: condoms Feeding: Formula Dispo: Plan for discharge tomorrow.   LOS: 1 day   Mcneil SoberRogers, Dorismar Chay C, CNM 07/04/2017, 8:37 AM

## 2017-07-04 NOTE — Progress Notes (Signed)
[place] interpreter [employer] [phone] used to introduce nurse, explain plan of care to mom and dad, answer questions.  Dad confirmed that breakfast will be provided by family and need not be ordered.  Mom educated on wearing bra to help milk from coming in.

## 2017-07-04 NOTE — Progress Notes (Signed)
CSW received consult for MOB due to her EPDS score of 10. CSW spoke with patient using interpreter [phone]. CSW inquired with MOB regarding her score of 10 and she stated she answered the questions based on her feelings prior to delivering. MOB reports a good mood since delivery and has a great support outside of the hospital. MOB stated she has all basic necessities at home for infant. CSW provided M[facility] with [facility] after discharge if needs arise.  Edwin Dadaarol Tierre Netto, MSW, LCSW-A Clinical Social Worker [facility] [phone]

## 2017-07-05 MED ORDER — IBUPROFEN 600 MG PO TABS: 600.0000 mg | ORAL_TABLET | Freq: Four times a day (QID) | ORAL | 0 refills | Status: AC

## 2017-07-05 NOTE — Progress Notes (Signed)
Reviewed normal dc teaching using interpreter; also recapped on PPD and that she has resources if she needs them.

## 2017-07-05 NOTE — Progress Notes (Signed)
Post Partum Day 2 *Assessment, exam and discharge instructions given with assistance from [employer] Video Interpreter: Jackelyn Kniferuyet #[phone]   Subjective: Ms. Ladora Danielguyet Wooley is 37 y.o. G2P1001 s/p SVD at 6363w4d. No acute events overnight. She denies any problems ambulating, voiding or po intake. She denies N/V. She has had flatus and reports a BM last night. Lochia is very light.  Objective: Blood pressure 119/75, pulse 63, temperature 98.3 F (36.8 C), temperature source Oral, resp. rate 18, height 4\' 9"  (1.448 m), weight 144 lb (65.3 kg), last menstrual period 10/04/2016, SpO2 100 %, not breastfeeding.  Physical Exam:  General: alert, cooperative and no distress Lochia: scant Uterine Fundus: firm, U+1 DVT Evaluation: No evidence of DVT seen on physical exam. Negative Homan's sign. No cords or calf tenderness. No significant calf/ankle edema.  Recent Labs    07/03/17 0245  HGB 13.5  HCT 39.6    Assessment/Plan: Discharge home and Contraception condoms Call [facility] to schedule BP check in 1 wk   LOS: 2 days   Raelyn Moraolitta Vernis Eid, MSN, CNM 07/05/2017, 9:38 AM

## 2017-07-05 NOTE — Discharge Summary (Signed)
OB Discharge Summary     Patient Name: Christy Huff DOB: 19-Apr-1980 MRN: [id number]  Date of admission: 07/03/2017 Delivering MD: Fanny BienHOMPSON, AARON B   Date of discharge: 07/05/2017  Admitting diagnosis: 40WKS CTX Intrauterine pregnancy: 6377w5d     Secondary diagnosis:  Active Problems:   Advanced maternal age in multigravida, second trimester   Normal labor  Additional problems: gHTN     Discharge diagnosis: Term Pregnancy Delivered and Gestational Hypertension - no antihypertensives needed                                                                                                Post partum procedures:none  Augmentation: none  Complications: None  Hospital course:  Onset of Labor With Vaginal Delivery     37 y.o. yo G2P1001 at 5077w5d was admitted in Active Labor on 07/03/2017. Patient had an uncomplicated labor course as follows:  Membrane Rupture Time/Date: 1:30 AM ,07/03/2017   Intrapartum Procedures: Episiotomy: None [1]                                         Lacerations:  2nd degree [3]  Patient had a delivery of a Viable infant. 07/03/2017  Information for the patient's newborn:  Dedra SkeensKa, Girl Josanna [[id number]][[id number]]  Delivery Method: Vag-Spont    Pateint had an uncomplicated postpartum course.  She is ambulating, tolerating a regular diet, passing flatus, and urinating well. Patient is discharged home in stable condition on 07/05/17.   Physical exam  Vitals:   07/03/17 2238 07/04/17 0511 07/04/17 1509 07/05/17 0616  BP: 112/64 (!) 109/58 114/81 119/75  Pulse: 79 (!) 52 66 63  Resp: 16 16 17 18   Temp: 98.2 F (36.8 C) 98.1 F (36.7 C) 98.6 F (37 C) 98.3 F (36.8 C)  TempSrc: Oral Oral Oral Oral  SpO2:   100%   Weight:      Height:       General: alert, cooperative and no distress Lochia: scant Uterine Fundus: firm, U+1 DVT Evaluation: No evidence of DVT seen on physical exam. Negative Homan's sign. No cords or calf tenderness. No significant calf/ankle  edema. Labs: Lab Results  Component Value Date   WBC 10.6 (H) 07/03/2017   HGB 13.5 07/03/2017   HCT 39.6 07/03/2017   MCV 79.8 07/03/2017   PLT 225 07/03/2017   CMP Latest Ref Rng & Units 07/02/2014  Glucose 65 - 99 mg/dL 84  BUN 6 - 20 mg/dL 8  Creatinine 7.820.44 - 9.561.00 mg/dL 2.130.59  Sodium 086135 - 578145 mmol/L 135  Potassium 3.5 - 5.1 mmol/L 4.0  Chloride 101 - 111 mmol/L 108  CO2 22 - 32 mmol/L 20(L)  Calcium 8.9 - 10.3 mg/dL 4.6(N8.8(L)  Total Protein 6.5 - 8.1 g/dL 6.5  Total Bilirubin 0.3 - 1.2 mg/dL 0.5  Alkaline Phos 38 - 126 U/L 328(H)  AST 15 - 41 U/L 28  ALT 14 - 54 U/L 14    Discharge instruction: per After Visit Summary and "Baby and  Me Booklet".  After visit meds:  Allergies as of 07/05/2017   No Known Allergies     Medication List    TAKE these medications   ibuprofen 600 MG tablet Commonly known as:  ADVIL,MOTRIN Take 1 tablet (600 mg total) by mouth every 6 (six) hours.   prenatal multivitamin Tabs tablet Take 1 tablet by mouth daily at 12 noon.       Diet: routine diet  Activity: Advance as tolerated. Pelvic rest for 6 weeks.   Outpatient follow up:1 week for BP recheck; then 6 weeks for postpartum visit Follow up Appt:No future appointments. Follow up Visit:No follow-ups on file.  Postpartum contraception: Condoms  Newborn Data: Live born female  Birth Weight: 8 lb 6.2 oz (3805 g) APGAR: 8, 9  Newborn Delivery   Birth date/time:  07/03/2017 04:21:00 Delivery type:  Vaginal, Spontaneous     Baby Feeding: Bottle Disposition:home with mother   07/05/2017 Raelyn Mora, CNM

## 2017-07-05 NOTE — Discharge Instructions (Signed)
H??ng d?n ch?m [place] t?i nh dnh cho b m? (Home Care Instructions for Mom) HO?T ??NG  D?n tr? l?i t?t c? cc ho?t ??ng th??ng xuyn c?a qu v?.  ?? b?n thn qu v? ???c ngh? ng?i. Ch?p m?t khi con qu v? ng?.  Trnh nng b?t k? v?t g n?ng h?n 10 lb (4,5 kg) cho ??n khi chuyn gia ch?m [place] s?c kh?e ni c th? lm v?y.  [place] cc ho?t ??ng c?n nhi?u n? l?c v n?ng l??ng (?i h?i g?ng s?c) cho ??n khi chuyn gia ch?m [place] s?c kh?e ch?p thu?n. ?i l?i v?i t?c ?? ch?m ??n trung bnh th??ng l an ton.  N?u qu v? ?? m?: ? Khng ht b?i, tro c?u thang ho?c li xe trong 4-6 tu?n. ? Nh? ai ? gip qu v? ? nh cho ??n khi qu v? c?m th?y c th? t? lm cc vi?c bnh th??ng. ? T?p th? d?c theo ch? d?n c?a chuyn gia ch?m [place] s?c kh?e, n?u ?i?u ny ???c p d?ng.  CH?Y MU M ??O Qu v? c th? ti?p t?c ch?y mu trong 4-6 tu?n sau khi sinh con. Theo th?i gian, l??ng mu th??ng gi?m v mu mu s? nh?t h?n. Tuy nhin, dng mu mu ?? nh?t c th? t?ng ln n?u qu v? ho?t ??ng qu m?c. N?u qu v? c?n nhi?u h?n m?t b?ng v? sinh trong m?t gi? v b?ng b? th?m ??t, ho?c n?u qu v? ?i ti?u ra m?t c?c mu l?n:  N?m xu?ng.  Nng cao chn.  ??t b?ng p l?nh ln ph?n b?ng d??i c?a qu v?.  Ngh? ng?i.  G?i chuyn gia ch?m [place] s?c kh?e. N?u qu v? nui con b?ng s?a m?, qu v? s? c kinh nguy?t tr? l?i vo b?t k? lc no t? lc 8 tu?n sau khi sinh con ??n lc qu v? thi cho con b. N?u qu v? khng nui con b?ng s?a m?, qu v? s? c kinh nguy?t tr? l?i trong vng 6-8 tu?n sau khi sinh con. CH?M [place] VNG ?Y CH?U Vng ?y ch?u, ho?c ?y ch?u, l m?t b? ph?n c? th? n?m gi?a hai ?i c?a qu v?. Sau khi sinh con, vng ny c?n ch?m [place] ??c bi?t. Tun theo nh?ng h??ng d?n ny theo ch? d?n c?a chuyn gia ch?m  s?c kh?e.  T?m b?n n??c ?m trong 15-20 pht.  S? d?ng b?ng t?m thu?c v thu?c x?t v kem gi?m ?au theo ch? d?n.  Khng s? d?ng nt b?ng v? sinh ho?c th?t r?a cho ??n khi m ??o thi ch?y mu.  M?i l?n  qu v? vo nh t?m: ? S? d?ng m?t chai phun n??c (peri bottle). ? Thay b?ng v? sinh. ? S? d?ng m?t kh?n ??t thay gi?y v? sinh cho ??n khi v?t khu c?a qu v? li?n l?i.  T?p bi t?p [place] m?i ngy. Bi t?p [place] gip duy tr cc c? h? tr? cho m ??o, bng quang v ru?t. Qu v? c th? t?p nh?ng bi t?p ny trong khi qu v? ??ng, ng?i hay n?m. ?? t?p cc bi t?p [place]: ? Gi? ch?t cc c? ? b?ng v cc c? xung quanh ?ng d?n sinh c?a qu v?. ? Gi? trong vi giy. ? Th? l?ng. ? L?p l?i cho ??n khi qu v? lm 5 l?n lin t?c.  ?? trnh b?nh tr? pht sinh ho?c n?ng thm: ? U?ng ?? n??c ?? gi? cho n??c ti?u trong ho?c c mu vng nh?t. ? Trnh r?n  m?nh khi ?i ??i ti?n. ? Ch? s? d?ng thu?c khng c?n k ??n v thu?c lm m?m phn theo ch? d?n c?a chuyn gia ch?m [place] s?c kh?e. CH?M [place] NG?C  M?c o ng?c v?a kht.  Trnh dng thu?c gi?m ?au khng c?n k ??n ?? ?i?u tr? c?m gic kh ch?u ? ng?c.  Ch??m ? vo ng?c ?? gi?m c?m gic kh ch?u khi c?n thi?t: ? Cho ? l?nh vo ti nh?a. ? ?? kh?n t?m ? gi?a da v ti ch??m. ? ?? ? l?nh trong kho?ng 20 pht ho?c theo ch? d?n c?a chuyn gia ch?m [place] s?c kh?e c?a qu v?.  [facility] D??NG  p d?ng ch? ?? ?n u?ng cn b?ng.  Khng c? g?ng gi?m cn nhanh b?ng cch gi?m l??ng calo h?p th?.  U?ng vitamin dng tr??c khi sinh cho ??n khi khm s?c kh?e sau sinh ho?c cho ??n khi chuyn gia ch?m [place] s?c kh?e ni qu v? d?ng l?i.  TR?M C?M SAU SINH Qu v? c th? th?y mnh khc m [employer]PG Industrieskhng c nguyn nhn r rng v khng th? ??i ph v?i t?t c? nh?ng thay ??i x?y ra do m?i sinh con. Tm tr?ng ny ???c g?i l tr?m c?m sau khi sinh. Tr?m c?m sau khi sinh x?y ra v l??ng hoc mn c?a qu v? thay ??i sau khi sinh con. N?u qu v? b? tr?m c?m sau khi sinh, hy nh?n s? h? tr? c?a ch?ng, b?n b v gia ?nh. N?u tr?m c?m khng t? h?t sau vi tu?n, hy lin h? chuyn gia ch?m [place] s?c kh?e. T? KI?M TRA V T? ki?m tra v hng thng, vo cng th?i ?i?m trong thng. N?u qu v? cho con b  s?a m?, ki?m tra v ngay sau khi cho con b, khi v ? ?? c?ng. N?u qu v? cho con b s?a m? v qu v? b?t ??u c kinh nguy?t, hy ki?m tra v vo ngy 5, 6 ho?c 7 c?a k? kinh nguy?t. Bo co v? b?t k? kh?i u, c?c, hay ti?t d?ch no cho chuyn gia ch?m [place] s?c kh?e. Nn bi?t r?ng v th??ng c u c?c n?u qu v? ?ang cho con b. Vi?c ny l bnh th??ng v ? khng ph?i l m?t nguy c? v? s?c kh?e. Arrie AranQUAN H? THN M?T V TNH D?C Trnh quan h? tnh d?c trong t nh?t 3-4 tu?n sau khi sinh con ho?c cho ??n khi ch?t d?ch mu nu ?? ? m ??o h?t hon ton. N?u qu v? mu?n trnh [place], hy s? d?ng m?t s? d?ng trnh [place]. Qu v? c th? c thai sau khi sinh con, ngay c? khi qu v? ch?a th?y kinh nguy?t. ?I KHM N?U:  Qu v? c?m th?y khng th? ??i ph v?i nh?ng thay ??i m m?t ??a tr? mang l?i cho cu?c s?ng c?a qu v?, v nh?ng c?m gic ny khng h?t sau vi tu?n.  Qu v? th?y c u, c?c, ho?c ti?t d?ch ? v.  NGAY L?P T?C ?I KHM N?U:  Mu lm ??t b?ng v? sinh trong th?i gian 1 gi? ho?c nhanh h?n.  Qu v? c: ? ?au ho?c co th?t r?t nhi?u ? vng b?ng d??i. ? Ti?t d?ch c mi kh ch?u ? m ??o. ? S?t m khng ki?m sot ???c b?ng thu?c. ? S?t v m?t vng trn v b? ?? v ?au. ? ?au ho?c ?? ? b?p chn. ? ?au ng?c ??t ng?t v r?t nhi?u. ? Kh th?. ? ?i ti?u ?au ho?c  v?n ?? v? th? l?c. °· Quý v? nôn trong 12 gi? ho?c lâu h?n. °· Quý v? b? ?au ??u d? d?i. °· Quý v? có suy ngh? nghiêm tr?ng v? vi?c t? làm t?n th??ng mình, con mình ho?c b?t k? ng??i nào khác. °Thông tin này không nh?m m?c ?ích thay th? cho l?i khuyên mà chuyên gia ch?m sóc s?c kh?e nói v?i quý v?. Hãy b?o ??m quý v? ph?i th?o lu?n b?t k? v?n ?? gì mà quý v? có v?i chuyên gia ch?m sóc s?c kh?e c?a quý v?. °Document Released: 06/18/2009 Document Revised: 04/22/2015 Document Reviewed: 07/02/2014 °Elsevier Interactive Patient Education © 2017 Elsevier Inc. ° °

## 2017-07-09 ENCOUNTER — Inpatient Hospital Stay (HOSPITAL_COMMUNITY): Admission: RE | Admit: 2017-07-09 | Payer: Medicaid Other | Source: Ambulatory Visit

## 2017-09-22 ENCOUNTER — Encounter (HOSPITAL_COMMUNITY): Payer: Self-pay

## 2019-07-10 IMAGING — US US MFM OB DETAIL+14 WK
1 series · 14 of 28 positions shown · non-contrast
Comparison: none

[Series 1: us mfm ob detail+14 wk · 114 acquisitions, 14 frames shown]
[im 5/114]
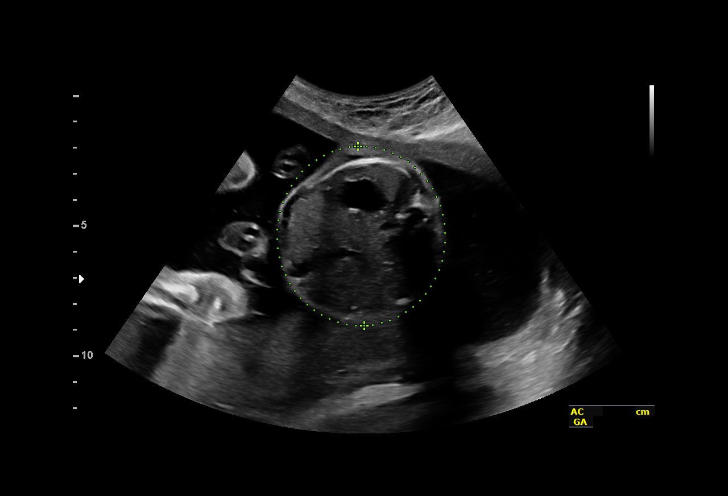
[im 13/114]
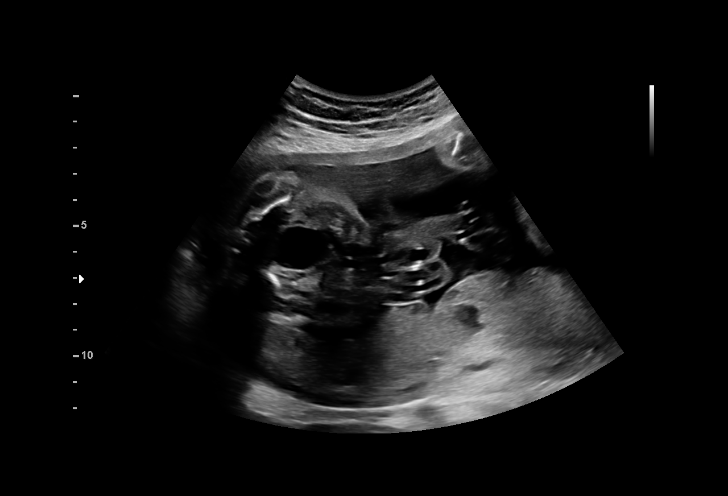
[im 21/114]
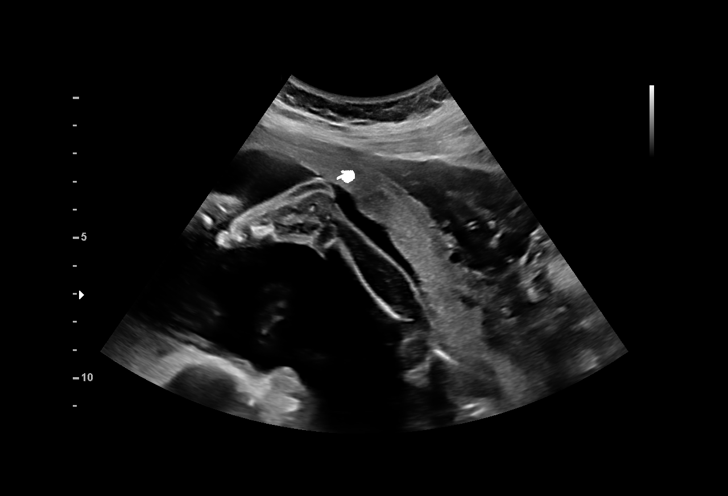
[im 30/114]
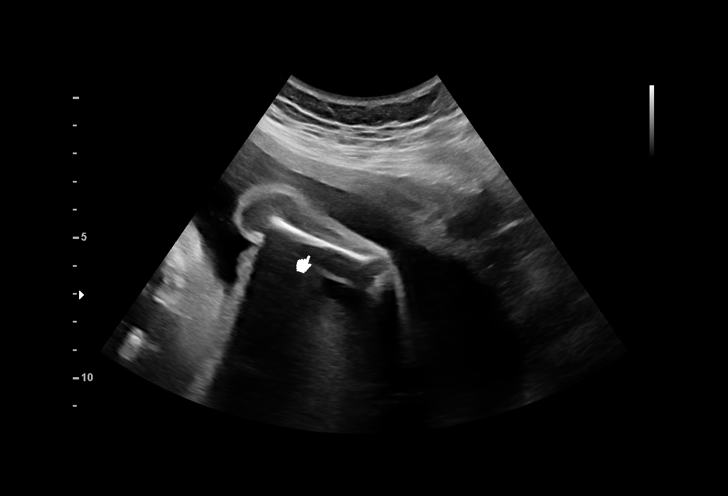
[im 38/114]
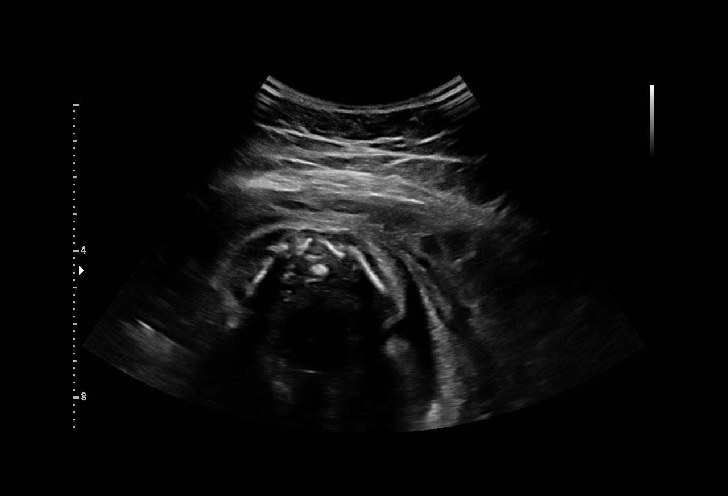
[im 47/114]
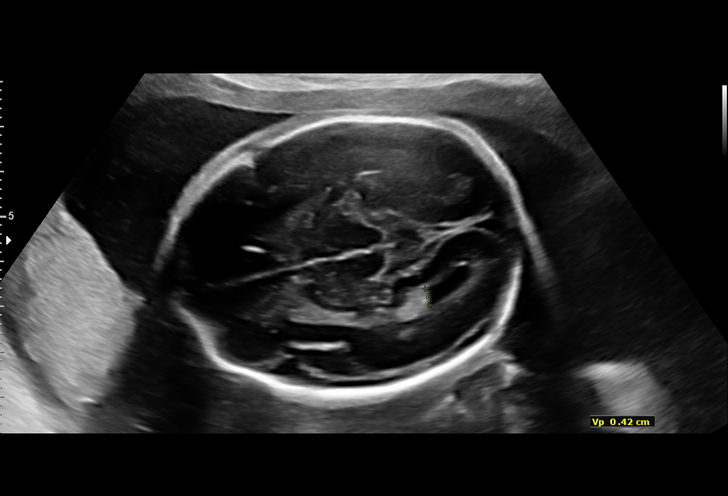
[im 55/114]
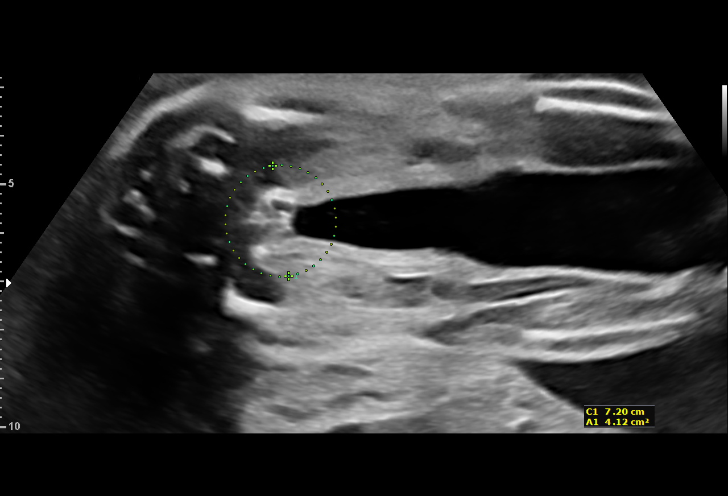
[im 63/114]
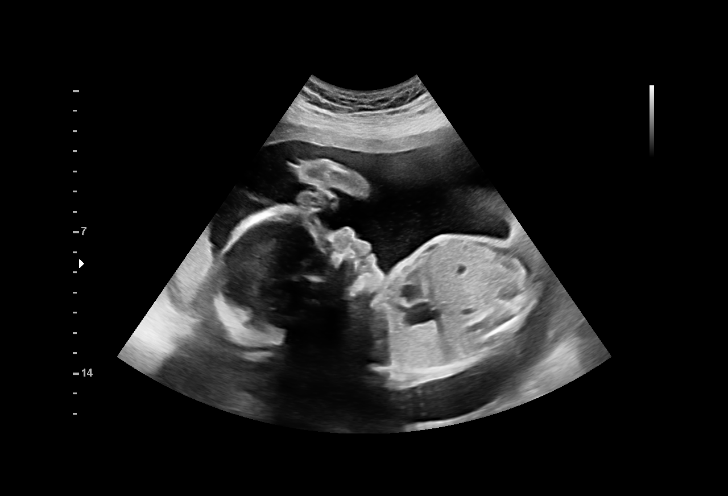
[im 72/114]
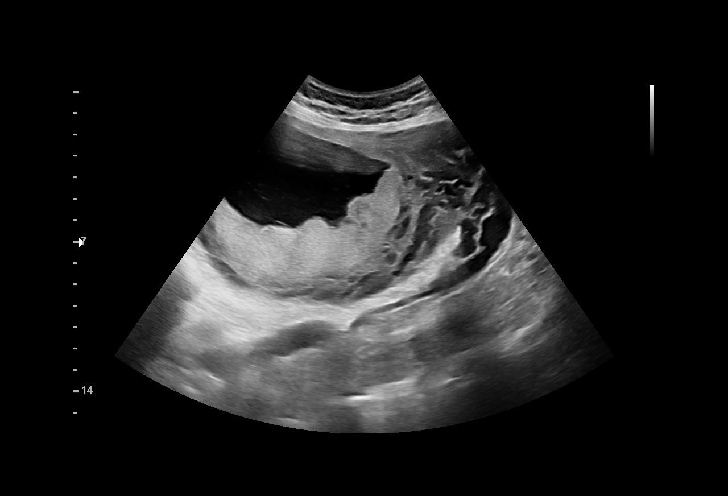
[im 80/114]
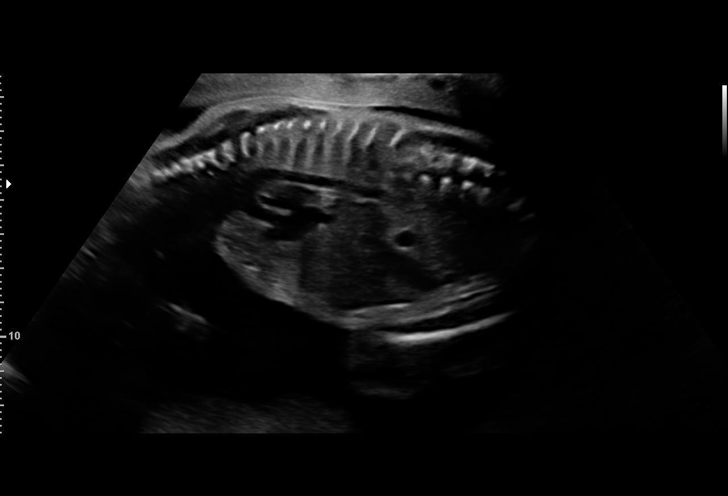
[im 88/114]
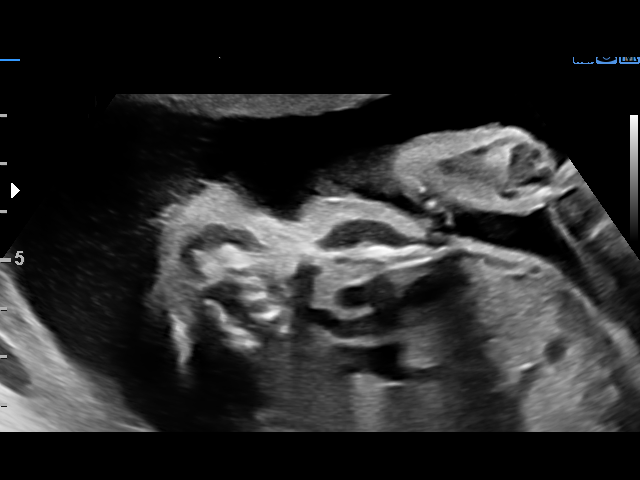
[im 97/114]
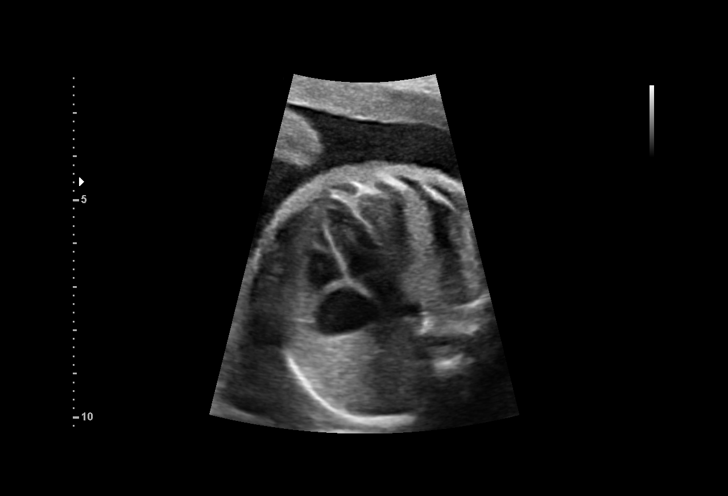
[im 105/114]
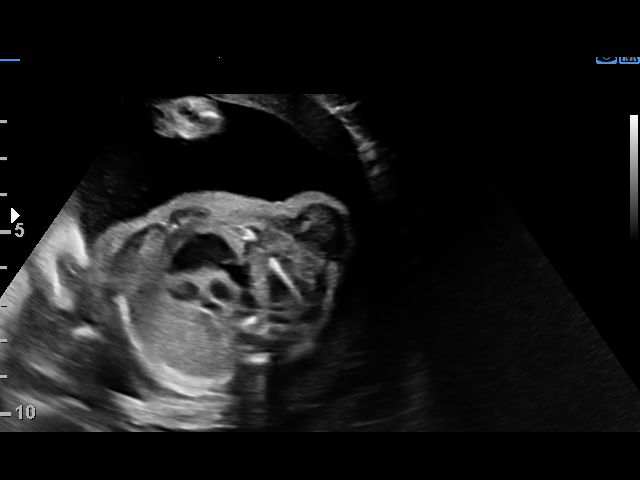
[im 114/114]
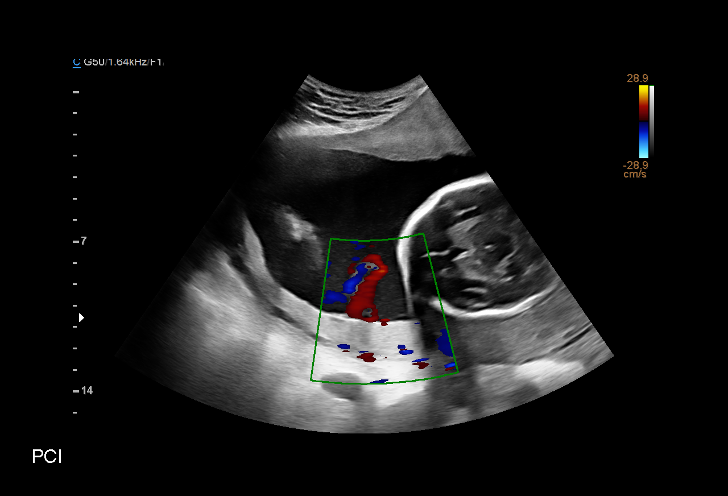

[14 of 28 positions shown; findings below may reference images not displayed]

Health Department
Karsten NP

1  ARLEEN                [id number]      [id number]     [id number]
POZISA
Indications

24 weeks gestation of pregnancy
Advanced maternal age multigravida 35+,
second trimester
Advanced Paternal Age - Encounter for
procreative genetic counseling (FOB 29yrs
old)
OB History

Blood Type:            Height:  4'9"   Weight (lb):  144       BMI:
Gravidity:    2         Term:   1
Living:       1
Fetal Evaluation

Num Of Fetuses:     1
Fetal Heart         141
Rate(bpm):
Cardiac Activity:   Observed
Presentation:       Variable
Placenta:           Posterior, above cervical os
P. Cord Insertion:  Visualized

Amniotic Fluid
AFI FV:      Subjectively within normal limits

Largest Pocket(cm)
5.69
Biometry

BPD:      60.7  mm     G. Age:  24w 5d         36  %    CI:        71.49   %    70 - 86
FL/HC:      18.8   %    18.7 -
HC:      228.6  mm     G. Age:  24w 6d         31  %    HC/AC:      1.10        1.04 -
AC:      208.5  mm     G. Age:  25w 3d         58  %    FL/BPD:     70.8   %    71 - 87
FL:         43  mm     G. Age:  24w 1d         16  %    FL/AC:      20.6   %    20 - 24
HUM:        39  mm     G. Age:  23w 6d         19  %

Est. FW:     740  gm    1 lb 10 oz      52  %
Gestational Age

LMP:           23w 2d        Date:  10/04/16                 EDD:   07/11/17
U/S Today:     24w 6d                                        EDD:   06/30/17
Best:          24w 6d     Det. By:  U/S (03/16/17)           EDD:   06/30/17
Anatomy

Cranium:               Appears normal         Aortic Arch:            Appears normal
Cavum:                 Appears normal         Ductal Arch:            Appears normal
Ventricles:            Appears normal         Diaphragm:              Appears normal
Choroid Plexus:        Appears normal         Stomach:                Appears normal, left
sided
Cerebellum:            Appears normal         Abdomen:                Appears normal
Posterior Fossa:       Appears normal         Abdominal Wall:         Appears nml (cord
insert, abd wall)
Nuchal Fold:           Not applicable (>20    Cord Vessels:           Appears normal (3
wks GA)                                        vessel cord)
Face:                  Appears normal         Kidneys:                Appear normal
(orbits and profile)
Lips:                  Appears normal         Bladder:                Appears normal
Thoracic:              Appears normal         Spine:                  Appears normal
Heart:                 Appears normal         Upper Extremities:      Appears normal
(4CH, axis, and
situs)
RVOT:                  Appears normal         Lower Extremities:      Appears normal
LVOT:                  Appears normal

Other:  Fetus appears to be a female. Heels and 5th digit visualized. Nasal
bone visualized. Open hands visualized. Technically difficult due to
fetal position and movement.
Cervix Uterus Adnexa

Cervix
Length:           4.53  cm.
Normal appearance by transabdominal scan.

Uterus
No abnormality visualized.

Left Ovary
Not visualized.

Right Ovary
Size(cm)       3.1  x   1.7    x  2.1       Vol(ml):
Within normal limits.
Impression

Single living intrauterine pregnancy at 24w 6Paath, Antonius Bambang
Patient states uncertain LMP by Vietnamese translator on site
Dating by today's ultrasound generates EDC 06/30/17
msQUAD was invalid
Variable presentation.
Placenta Posterior, above cervical os.
Appropriate fetal growth.
Normal amniotic fluid volume.
The fetal anatomic survey is complete.
Normal fetal anatomy.
No fetal anomalies or soft markers of aneuploidy seen.
The adnexa appear normal bilaterally without masses.
The cervix measures 4.53cm on transabdominal imaging
without funneling.
Recommendations

1. see Genetic counseling
2. interval growth in 6 weeks (AMA).
